# Patient Record
Sex: Female | Born: 1965 | Race: White | Hispanic: No | Marital: Single | Smoking: Former smoker
Health system: Southern US, Community
[De-identification: ages and names within clinical notes are randomized; demographics above are authoritative.]

## PROBLEM LIST (undated history)

## (undated) DIAGNOSIS — F1910 Other psychoactive substance abuse, uncomplicated: Secondary | ICD-10-CM

## (undated) DIAGNOSIS — R112 Nausea with vomiting, unspecified: Secondary | ICD-10-CM

## (undated) DIAGNOSIS — F329 Major depressive disorder, single episode, unspecified: Secondary | ICD-10-CM

## (undated) DIAGNOSIS — G709 Myoneural disorder, unspecified: Secondary | ICD-10-CM

## (undated) DIAGNOSIS — J302 Other seasonal allergic rhinitis: Secondary | ICD-10-CM

## (undated) DIAGNOSIS — J310 Chronic rhinitis: Secondary | ICD-10-CM

## (undated) DIAGNOSIS — F32A Depression, unspecified: Secondary | ICD-10-CM

## (undated) DIAGNOSIS — T7840XA Allergy, unspecified, initial encounter: Secondary | ICD-10-CM

## (undated) DIAGNOSIS — J45909 Unspecified asthma, uncomplicated: Secondary | ICD-10-CM

## (undated) DIAGNOSIS — D649 Anemia, unspecified: Secondary | ICD-10-CM

## (undated) DIAGNOSIS — C801 Malignant (primary) neoplasm, unspecified: Secondary | ICD-10-CM

## (undated) DIAGNOSIS — I10 Essential (primary) hypertension: Secondary | ICD-10-CM

## (undated) DIAGNOSIS — M797 Fibromyalgia: Secondary | ICD-10-CM

## (undated) DIAGNOSIS — F419 Anxiety disorder, unspecified: Secondary | ICD-10-CM

## (undated) DIAGNOSIS — Z9889 Other specified postprocedural states: Secondary | ICD-10-CM

## (undated) DIAGNOSIS — G4730 Sleep apnea, unspecified: Secondary | ICD-10-CM

## (undated) HISTORY — DX: Other psychoactive substance abuse, uncomplicated: F19.10

## (undated) HISTORY — DX: Allergy, unspecified, initial encounter: T78.40XA

## (undated) HISTORY — DX: Myoneural disorder, unspecified: G70.9

## (undated) HISTORY — PX: RETINAL LASER PROCEDURE: SHX2339

## (undated) HISTORY — PX: KNEE ARTHROSCOPY: SUR90

## (undated) HISTORY — PX: TONSILLECTOMY: SUR1361

## (undated) HISTORY — PX: CARPAL TUNNEL RELEASE: SHX101

## (undated) HISTORY — PX: REDUCTION MAMMAPLASTY: SUR839

## (undated) HISTORY — PX: CATARACT EXTRACTION, BILATERAL: SHX1313

---

## 1986-08-30 HISTORY — PX: BREAST SURGERY: SHX581

## 1999-01-18 ENCOUNTER — Emergency Department (HOSPITAL_COMMUNITY): Admission: EM | Admit: 1999-01-18 | Discharge: 1999-01-18 | Payer: Self-pay | Admitting: Emergency Medicine

## 1999-04-13 ENCOUNTER — Emergency Department (HOSPITAL_COMMUNITY): Admission: EM | Admit: 1999-04-13 | Discharge: 1999-04-13 | Payer: Self-pay | Admitting: Internal Medicine

## 1999-11-03 ENCOUNTER — Other Ambulatory Visit: Admission: RE | Admit: 1999-11-03 | Discharge: 1999-11-03 | Payer: Self-pay | Admitting: *Deleted

## 2000-11-23 ENCOUNTER — Ambulatory Visit: Admission: RE | Admit: 2000-11-23 | Discharge: 2000-11-23 | Payer: Self-pay | Admitting: Gynecology

## 2002-12-24 ENCOUNTER — Other Ambulatory Visit: Admission: RE | Admit: 2002-12-24 | Discharge: 2002-12-24 | Payer: Self-pay | Admitting: *Deleted

## 2003-08-31 HISTORY — PX: ABDOMINAL HYSTERECTOMY: SHX81

## 2004-06-04 ENCOUNTER — Other Ambulatory Visit: Admission: RE | Admit: 2004-06-04 | Discharge: 2004-06-04 | Payer: Self-pay | Admitting: *Deleted

## 2005-07-20 ENCOUNTER — Ambulatory Visit (HOSPITAL_BASED_OUTPATIENT_CLINIC_OR_DEPARTMENT_OTHER): Admission: RE | Admit: 2005-07-20 | Discharge: 2005-07-20 | Payer: Self-pay | Admitting: Family Medicine

## 2005-07-25 ENCOUNTER — Ambulatory Visit: Payer: Self-pay | Admitting: Internal Medicine

## 2006-09-07 ENCOUNTER — Other Ambulatory Visit: Admission: RE | Admit: 2006-09-07 | Discharge: 2006-09-07 | Payer: Self-pay | Admitting: *Deleted

## 2008-02-22 ENCOUNTER — Other Ambulatory Visit: Admission: RE | Admit: 2008-02-22 | Discharge: 2008-02-22 | Payer: Self-pay | Admitting: Gynecology

## 2011-01-15 NOTE — Consult Note (Signed)
[facility]  Patient:    Ann Davenport, MISHKIN                         MRN: [id number] Proc. Date: 11/23/00 Adm. Date:  [id number] Attending:  Jeannette Corpus CC:         Almedia Balls. Randell Patient, M.D.  Telford Nab, R.N.   Consultation Report  HISTORY OF PRESENT ILLNESS:  [age]-year-old white female seen in consultation at the request of Dr. Viviann Spare R. Fore for evaluation of ovarian cysts.  The patient has a long-standing history of ovarian cysts which have been associated with intermittent pain.  The cysts have essentially waxed and waned in size.  Most recently, the patient had an ultrasound on February 28th showing a right cystic adnexal mass measuring 4.1 x 3.5 x 4.3 cm, which was slightly tender; in addition, she has a 4.3 x 3.8 x 3.3-cm cyst on the left side with a possible hydrosalpinx.  Prior ultrasound was in November of 2001, which is considerably different.  The patient has previously had an abdominal hysterectomy for large uterine fibroids.  Subsequently, because of worsening pain and cysts, she underwent a laparoscopy on Jan 08, 2000.  At that time, extensive lysis of adhesions was performed and the patient received considerable relief in her right lower quadrant.  She reports now that she is not having any pain at all.  In the interim, she has had two CA125 values done that were 32 and 28 u/ml (this is elevated for this laboratory, where the upper limit of normal is 21).  The patient has no family history of ovarian cancer.  She has a grandmother with breast cancer but no other family members with breast cancer.  There are no colon cancer or endometrial cancer members in the family.  REVIEW OF SYSTEMS:  Otherwise negative.  CURRENT MEDICATIONS:  Progesterone cream.  DRUG ALLERGIES:  SULFA and ERYTHROMYCIN.  PAST SURGICAL HISTORY:  The patient has also had breast reduction surgery and tonsils and adenoidectomy.  PHYSICAL  EXAMINATION:  VITAL SIGNS:  Blood pressure 122/80, pulse 80, respiratory rate 18.  Height 5 feet 4 inches.  Weight 148 pounds.  GENERAL:  The patient is a healthy white female in no acute distress.  HEENT:  Negative.  NECK:  Supple without thyromegaly.  NODES:  There is no supraclavicular or inguinal adenopathy.  ABDOMEN:  Soft and nontender.  No mass, organomegaly, ascites or herniae are noted.  Her incision is well-healed.  PELVIC:  EG/BUS are normal.  Vagina is clean, well-supported.  No lesions noted.  Bimanual and rectovaginal exam reveal no masses, induration or nodularity.  IMPRESSION:  Bilateral ovarian cysts which seem to wax and wane.  In the past they have been associated with pain but the patient reports her pain has entirely resolved.  Slightly elevated CA125.  RECOMMENDATION:  At this juncture, the patient is strongly desirous of preserving ovarian function and given the fact that she is without any symptoms, I think it would be reasonable to follow her.  We did have a lengthy discussion regarding the pros and cons of using CA125 in premenopausal women to assess adnexal masses and she is aware that there are a number of reasons to have a false-positive result.  At this juncture, the patient does not wish to pursue surgical exploration and I indicated that I would be supportive of that approach.  Certainly, if she has worsening of her symptoms or followup ultrasound shows  the mass to have gotten considerably bigger or developed into a more ominous architecture, surgical intervention might be reconsidered.  Patient is in agreement with this plan.  She will return to the care of Dr. Randell Patient and I recommended she have a repeat ultrasound in approximately three months. DD:  11/23/00 TD:  11/24/00 Job: [id number]

## 2011-01-15 NOTE — Procedures (Signed)
NAME:  Ann Davenport, Ann Davenport                ACCOUNT NO.:  [id number]   MEDICAL RECORD NO.:  [id number]          PATIENT TYPE:  OUT   LOCATION:  SLEEP CENTER                 FACILITY:  [facility]   PHYSICIAN:  Clinton D. Maple Hudson, M.D. DATE OF BIRTH:  04-16-1966   DATE OF STUDY:  07/20/2005                              NOCTURNAL POLYSOMNOGRAM   REFERRING PHYSICIAN:  Dr. Raquel Sarna.   INDICATIONS FOR STUDY:  Insomnia with sleep apnea.  Epworth Sleepiness Score  9/24.  BMI 23.  Weight 135 pounds.   SLEEP ARCHITECTURE:  Short total sleep time 281 minutes with sleep  efficiency 65%.  Stage 1 was 5%; stage 2 57%; stages 3 and 4, 25%; REM 13%  of total sleep time.  Sleep latency 52 minutes, REM latency 145 minutes,  awake after sleep onset 101 minutes, arousal index increased at 30.1.  Sleep  onset was at 11:11 p.m., and she indicated that her preferred pattern is to  prepare for sleep sitting in a chair which was hindered by monitoring leads  for this study night.  She was awake from approximately 11:30 until 12:15  a.m. and again from 3:15 to 4 a.m.  She wore her bruxism guard.  No bedtime  medications were taken.   RESPIRATORY DATA:  Apnea-hypopnea index (AHI, RDI):  9 obstructive events  per hour, indicating mild obstructive sleep apnea/hypopnea syndrome. There  were 1 central apnea, 8 obstructive apneas, and 33 hypopneas.  She slept  only supine.  REM AHI 16 per hour.  She was unable to maintain sleep and had  insufficient early events to permit use of CPAP titration by split protocol  on this study night.   OXYGEN DATA:  Mild snoring with oxygen desaturation to a nadir of 91%. Mean  oxygen saturation through the study was 98% on room air.   CARDIAC DATA:  Normal sinus rhythm.   MOVEMENT/PARASOMNIA:  A total of 38 limb jerks were reported of which only 2  were associated with arousal or awakening for periodic limb movement with  arousal index of 0.4 per hour, which is normal.  No bathroom  trips.   IMPRESSION/RECOMMENDATIONS:  1.  Mild obstructive sleep apnea/hypopnea syndrome, AHI 9 per hour while      sleeping only supine.  There was mild snoring.  Events were more common      while in REM (AHI 16 per hour).  2.  Scores in this range may be appropriate for CPAP therapy if more      conservative measures are insufficient.      Consider return for CPAP titration, bringing a sleep medication if      appropriate.  REM suppressing therapy with antidepressants has been      occasionally successful but may increase limb jerk and muscle discomfort      conditions.      Clinton D. Maple Hudson, M.D.  Diplomate, Biomedical engineer of Sleep Medicine  Electronically Signed     CDY/MEDQ  D:  07/25/2005 10:31:43  T:  07/25/2005 22:59:04  Job:  [id number]

## 2012-10-29 ENCOUNTER — Ambulatory Visit (INDEPENDENT_AMBULATORY_CARE_PROVIDER_SITE_OTHER): Payer: 59 | Admitting: Emergency Medicine

## 2012-10-29 VITALS — BP 117/67 | HR 118 | Temp 98.2°F | Resp 20 | Ht 65.0 in | Wt 153.0 lb

## 2012-10-29 DIAGNOSIS — E860 Dehydration: Secondary | ICD-10-CM

## 2012-10-29 DIAGNOSIS — A088 Other specified intestinal infections: Secondary | ICD-10-CM

## 2012-10-29 DIAGNOSIS — R112 Nausea with vomiting, unspecified: Secondary | ICD-10-CM

## 2012-10-29 MED ORDER — ONDANSETRON 4 MG PO TBDP
4.0000 mg | ORAL_TABLET | Freq: Once | ORAL | Status: AC
  Administered 2012-10-29: 4 mg via ORAL

## 2012-10-29 MED ORDER — ONDANSETRON 8 MG PO TBDP: 8.0000 mg | ORAL_TABLET | Freq: Three times a day (TID) | ORAL | Status: DC | PRN

## 2012-10-29 MED ORDER — LOPERAMIDE HCL 2 MG PO TABS: ORAL_TABLET | ORAL | Status: DC

## 2012-10-29 NOTE — Patient Instructions (Addendum)
Viral Gastroenteritis Viral gastroenteritis is also known as stomach flu. This condition affects the stomach and intestinal tract. It can cause sudden diarrhea and vomiting. The illness typically lasts 3 to 8 days. Most people develop an immune response that eventually gets rid of the virus. While this natural response develops, the virus can make you quite ill. CAUSES  Many different viruses can cause gastroenteritis, such as rotavirus or noroviruses. You can catch one of these viruses by consuming contaminated food or water. You may also catch a virus by sharing utensils or other personal items with an infected person or by touching a contaminated surface. SYMPTOMS  The most common symptoms are diarrhea and vomiting. These problems can cause a severe loss of body fluids (dehydration) and a body salt (electrolyte) imbalance. Other symptoms may include:  Fever.  Headache.  Fatigue.  Abdominal pain. DIAGNOSIS  Your caregiver can usually diagnose viral gastroenteritis based on your symptoms and a physical exam. A stool sample may also be taken to test for the presence of viruses or other infections. TREATMENT  This illness typically goes away on its own. Treatments are aimed at rehydration. The most serious cases of viral gastroenteritis involve vomiting so severely that you are not able to keep fluids down. In these cases, fluids must be given through an intravenous line (IV). HOME CARE INSTRUCTIONS   Drink enough fluids to keep your urine clear or pale yellow. Drink small amounts of fluids frequently and increase the amounts as tolerated.  Ask your caregiver for specific rehydration instructions.  Avoid:  Foods high in sugar.  Alcohol.  Carbonated drinks.  Tobacco.  Juice.  Caffeine drinks.  Extremely hot or cold fluids.  Fatty, greasy foods.  Too much intake of anything at one time.  Dairy products until 24 to 48 hours after diarrhea stops.  You may consume probiotics.  Probiotics are active cultures of beneficial bacteria. They may lessen the amount and number of diarrheal stools in adults. Probiotics can be found in yogurt with active cultures and in supplements.  Wash your hands well to avoid spreading the virus.  Only take over-the-counter or prescription medicines for pain, discomfort, or fever as directed by your caregiver. Do not give aspirin to children. Antidiarrheal medicines are not recommended.  Ask your caregiver if you should continue to take your regular prescribed and over-the-counter medicines.  Keep all follow-up appointments as directed by your caregiver. SEEK IMMEDIATE MEDICAL CARE IF:   You are unable to keep fluids down.  You do not urinate at least once every 6 to 8 hours.  You develop shortness of breath.  You notice blood in your stool or vomit. This may look like coffee grounds.  You have abdominal pain that increases or is concentrated in one small area (localized).  You have persistent vomiting or diarrhea.  You have a fever.  The patient is a child younger than 3 months, and he or she has a fever.  The patient is a child older than 3 months, and he or she has a fever and persistent symptoms.  The patient is a child older than 3 months, and he or she has a fever and symptoms suddenly get worse.  The patient is a baby, and he or she has no tears when crying. MAKE SURE YOU:   Understand these instructions.  Will watch your condition.  Will get help right away if you are not doing well or get worse. Document Released: 08/16/2005 Document Revised: 11/08/2011 Document Reviewed: 06/02/2011   ExitCare Patient Information 2013 [employer]. Clear Liquid Diet The clear liquid dietconsists of foods that are liquid or will become liquid at room temperature.You should be able to see through the liquid and beverages. Examples of foods allowed on a clear liquid diet include fruit juice, broth or bouillon, gelatin, or frozen  ice pops. The purpose of this diet is to provide necessary fluid, electrolytes such as sodium and potassium, and energy to keep the body functioning during times when you are not able to consume a regular diet.A clear liquid diet should not be continued for long periods of time as it is not nutritionally adequate.  REASONS FOR USING A CLEAR LIQUID DIET  In sudden onset (acute) conditions for a patient before or after surgery.  As the first step in oral feeding.  For fluid and electrolyte replacement in diarrheal diseases.  As a diet before certain medical tests are performed. ADEQUACY The clear liquid diet is adequate only in ascorbic acid, according to the Recommended Dietary Allowances of the National Research Council. CHOOSING FOODS Breads and Starches  Allowed:  None are allowed.  Avoid: All are avoided. Vegetables  Allowed:  Strained tomato or vegetable juice.  Avoid: Any others. Fruit  Allowed:  Strained fruit juices and fruit drinks. Include 1 serving of citrus or vitamin C-enriched fruit juice daily.  Avoid: Any others. Meat and Meat Substitutes  Allowed:  None are allowed.  Avoid: All are avoided. Milk  Allowed:  None are allowed.  Avoid: All are avoided. Soups and Combination Foods  Allowed:  Clear bouillon, broth, or strained broth-based soups.  Avoid: Any others. Desserts and Sweets  Allowed:  Sugar, honey. High protein gelatin. Flavored gelatin, ices, or frozen ice pops that do not contain milk.  Avoid: Any others. Fats and Oils  Allowed:  None are allowed.  Avoid: All are avoided. Beverages  Allowed: Cereal beverages, coffee (regular or decaffeinated), tea, or soda at the discretion of your caregiver.  Avoid: Any others. Condiments  Allowed:  Iodized salt.  Avoid: Any others, including pepper. Supplements  Allowed:  Liquid nutrition beverages.  Avoid: Any others that contain lactose or fiber. SAMPLE MEAL PLAN Breakfast  4 oz (120  mL) strained orange juice.   to 1 cup (125 to 250 mL) gelatin (plain or fortified).  1 cup (250 mL) beverage (coffee or tea).  Sugar, if desired. Midmorning Snack   cup (125 mL) gelatin (plain or fortified). Lunch  1 cup (250 mL) broth or consomm.  4 oz (120 mL) strained grapefruit juice.   cup (125 mL) gelatin (plain or fortified).  1 cup (250 mL) beverage (coffee or tea).  Sugar, if desired. Midafternoon Snack   cup (125 mL) fruit ice.   cup (125 mL) strained fruit juice. Dinner  1 cup (250 mL) broth or consomm.   cup (125 mL) cranberry juice.   cup (125 mL) flavored gelatin (plain or fortified).  1 cup (250 mL) beverage (coffee or tea).  Sugar, if desired. Evening Snack  4 oz (120 mL) strained apple juice (vitamin C-fortified).   cup (125 mL) flavored gelatin (plain or fortified). Document Released: 08/16/2005 Document Revised: 11/08/2011 Document Reviewed: 11/13/2010 ExitCare Patient Information 2013 [employer].  

## 2012-10-29 NOTE — Progress Notes (Signed)
[facility] [address] [place] [address] [phone]  Date:  10/29/2012   Name:  Ann Davenport   DOB:  06-Sep-1965   MRN:  [id number]  PCP:  No primary Ann Davenport on file.    Chief Complaint: Fever, Emesis and Diarrhea   History of Present Illness:  Ann Davenport is a 47 y.o. very pleasant female patient who presents with the following:  Ill since last night with nausea and vomiting and watery diarrhea.  Cramps, had a fever last night.  Chilled.  Not able to hold down liquids.  Denies blood mucous or pus in stools.  No improvement with over the counter medications or other home remedies. No ill contacts.    There is no problem list on file for this patient.   Past Medical History  Diagnosis Date  . Allergy   . Neuromuscular disorder   . Substance abuse     Past Surgical History  Procedure Laterality Date  . Breast surgery      History  Substance Use Topics  . Smoking status: Never Smoker   . Smokeless tobacco: Not on file  . Alcohol Use: No    History reviewed. No pertinent family history.  Allergies  Allergen Reactions  . Niacin And Related   . Sulfa Antibiotics Rash    Medication list has been reviewed and updated.  No current outpatient prescriptions on file prior to visit.   No current facility-administered medications on file prior to visit.    Review of Systems:  As per HPI, otherwise negative.    Physical Examination: Filed Vitals:   10/29/12 1543  BP: 117/67  Pulse: 118  Temp: 98.2 F (36.8 C)  Resp: 20   Filed Vitals:   10/29/12 1543  Height: 5\' 5"  (1.651 m)  Weight: 153 lb (69.4 kg)   Body mass index is 25.46 kg/(m^2). Ideal Body Weight: Weight in (lb) to have BMI = 25: 149.9  GEN: WDWN, NAD, Non-toxic, A & O x 3 HEENT: Atraumatic, Normocephalic. Neck supple. No masses, No LAD. Ears and Nose: No external deformity. CV: RRR, No M/G/R. No JVD. No thrill. No extra heart sounds. PULM: CTA B, no wheezes,  crackles, rhonchi. No retractions. No resp. distress. No accessory muscle use. ABD: S, NT, ND, +BS. No rebound. No HSM. EXTR: No c/c/e NEURO Normal gait.  PSYCH: Normally interactive. Conversant. Not depressed or anxious appearing.  Calm demeanor.    Assessment and Plan: Gastroenteritis Responded to 2 liters lactated ringers and zofran Clears for 24 hours Follow up as needed  Ann Dane, MD

## 2013-12-31 ENCOUNTER — Other Ambulatory Visit: Payer: Self-pay | Admitting: Internal Medicine

## 2013-12-31 DIAGNOSIS — R109 Unspecified abdominal pain: Secondary | ICD-10-CM

## 2014-01-01 ENCOUNTER — Ambulatory Visit
Admission: RE | Admit: 2014-01-01 | Discharge: 2014-01-01 | Disposition: A | Discharge status: Home / Self Care | Payer: 59 | Source: Ambulatory Visit | Attending: Internal Medicine | Admitting: Internal Medicine

## 2014-01-01 DIAGNOSIS — R109 Unspecified abdominal pain: Secondary | ICD-10-CM

## 2014-02-05 ENCOUNTER — Other Ambulatory Visit: Payer: Self-pay | Admitting: Internal Medicine

## 2014-02-05 ENCOUNTER — Ambulatory Visit
Admission: RE | Admit: 2014-02-05 | Discharge: 2014-02-05 | Disposition: A | Discharge status: Home / Self Care | Payer: 59 | Source: Ambulatory Visit | Attending: Internal Medicine | Admitting: Internal Medicine

## 2014-02-05 DIAGNOSIS — S93409A Sprain of unspecified ligament of unspecified ankle, initial encounter: Secondary | ICD-10-CM

## 2014-02-05 DIAGNOSIS — S93401A Sprain of unspecified ligament of right ankle, initial encounter: Secondary | ICD-10-CM

## 2014-06-28 NOTE — H&P (Signed)
Assessment  Obstructive sleep apnea (327.23) (G47.33). Deviated nasal septum (470) (J34.2). Nasal obstruction (478.19) (J34.89). Discussed  Chronic nasal obstruction, worse and after the recent injury. She clearly has a septal deviation and large turbinates. She also has a small nose in general. She has mild sleep apnea. I think with nasal septal and turbinate surgery, we can improve her nasal airways. We discussed it is unlikely this will cure the sleep apnea. She is interested in surgery. This was discussed in detail. Reason For Visit  Ann Davenport is here today at the kind request of Enid Baas for consultation and opinion for deviated nasal septum due to injury. HPI  One week ago, she accidentally hit the right side of the nose with her car door. She has had significant discomfort and swelling. She had trouble breathing through her nose prior to this but now it's worse especially on the right side. She had a nasal injury as a child but never had it treated. She has mild sleep apnea. She is going to provide [place] with the sleep study results as soon as she can. Allergies  Epinephrine Latex Sulfa Drugs. Current Meds  Bonine 25 MG CHEW (Meclizine HCl);; RPT Vitamin C TABS;; RPT FLUoxetine HCl - 20 MG Oral Capsule;; RPT Vitamin D CAPS;; RPT Loratadine 10 MG Oral Tablet;; RPT Symbicort AERO;; RPT Zinc CAPS;; RPT. Active Problems  Allergic rhinitis   (477.9) (J30.9) Obstructive sleep apnea   (327.23) (G47.33). PMH  History of malignant melanoma (V10.82) (Y65.035); lower leg. [place]  Breast Surgery Reduction Procedure Hysterectomy Knee Surgery Neuroplasty Decompression Median Nerve At Carpal Tunnel Oral Surgery Tooth Extraction Tonsillectomy. Family Hx  Cancer of head: Father (C76.0) Cancer of neck: Father (C76.0) Jaw cancer: Father (C76.0) No pertinent family history: Mother. Personal Hx  Caffeine use (V49.89) (F15.90); 2-3 cups daily Never smoker No alcohol use Non-smoker  (V49.89) (Z78.9). ROS  12 system ROS was obtained and reviewed on the Health Maintenance form dated today.  Positive responses are shown above.  If the symptom is not checked, the patient has denied it. Vital Signs   Recorded by [facility] on 06 Jun 2014 01:51 PM BP:110/70,  Height: 63.5 in, Weight: 178 lb , BMI: 31 kg/m2,  BMI Calculated: 31.04 ,  BSA Calculated: 1.85. Physical Exam  APPEARANCE: Well developed, well nourished, in no acute distress.  Normal affect, in a pleasant mood.  Oriented to time, place and person. COMMUNICATION: Normal voice   HEAD & FACE:  No scars, lesions or masses of head and face.  Sinuses nontender to palpation.  Salivary glands without mass or tenderness.  Facial strength symmetric.  No facial lesion, scars, or mass. EYES: EOMI with normal primary gaze alignment. Visual acuity grossly intact.  PERRLA EXTERNAL EAR & NOSE: No scars, lesions or masses. There is tenderness and swelling of the right nasal bone but no obvious displacement. EAC & TYMPANIC MEMBRANE:  EAC shows no obstructing lesions or debris and tympanic membranes are normal bilaterally with good movement to insufflation. GROSS HEARING: Normal   TMJ:  Nontender  INTRANASAL EXAM: Nasal septal deviation with columellar show on the right. Small nasal cavities with S-shaped deformity, large inferior turbinates and minimal airways bilaterally. NASOPHARYNX: Normal, without lesions. LIPS, TEETH & GUMS: No lip lesions, normal dentition and normal gums. ORAL CAVITY/OROPHARYNX:  Oral mucosa moist without lesion or asymmetry of the palate, tongue, tonsil or posterior pharynx. NECK:  Supple without adenopathy or mass. THYROID:  Normal with no masses palpable.  NEUROLOGIC:  No gross CN deficits. No nystagmus noted.   LYMPHATIC:  No enlarged nodes palpable. Signature  Electronically signed by : Izora Gala  M.D.; 06/06/2014 1:59 PM EST.

## 2014-07-02 ENCOUNTER — Encounter (HOSPITAL_BASED_OUTPATIENT_CLINIC_OR_DEPARTMENT_OTHER): Payer: Self-pay | Admitting: *Deleted

## 2014-07-03 ENCOUNTER — Encounter (HOSPITAL_BASED_OUTPATIENT_CLINIC_OR_DEPARTMENT_OTHER): Payer: Self-pay | Admitting: *Deleted

## 2014-07-03 NOTE — Progress Notes (Signed)
No labs needed-did review with dr hodieron

## 2014-07-04 ENCOUNTER — Ambulatory Visit: Payer: Self-pay | Admitting: Otolaryngology

## 2014-07-04 NOTE — H&P (Signed)
Assessment  Obstructive sleep apnea (327.23) (G47.33). Deviated nasal septum (470) (J34.2). Nasal obstruction (478.19) (J34.89). Discussed  Chronic nasal obstruction, worse and after the recent injury. She clearly has a septal deviation and large turbinates. She also has a small nose in general. She has mild sleep apnea. I think with nasal septal and turbinate surgery, we can improve her nasal airways. We discussed it is unlikely this will cure the sleep apnea. She is interested in surgery. This was discussed in detail. Reason For Visit  Ann Davenport is here today at the kind request of Enid Baas for consultation and opinion for deviated nasal septum due to injury. HPI  One week ago, she accidentally hit the right side of the nose with her car door. She has had significant discomfort and swelling. She had trouble breathing through her nose prior to this but now it's worse especially on the right side. She had a nasal injury as a child but never had it treated. She has mild sleep apnea. She is going to provide [place] with the sleep study results as soon as she can. Allergies  Epinephrine Latex Sulfa Drugs. Current Meds  Bonine 25 MG CHEW (Meclizine HCl);; RPT Vitamin C TABS;; RPT FLUoxetine HCl - 20 MG Oral Capsule;; RPT Vitamin D CAPS;; RPT Loratadine 10 MG Oral Tablet;; RPT Symbicort AERO;; RPT Zinc CAPS;; RPT. Active Problems  Allergic rhinitis   (477.9) (J30.9) Obstructive sleep apnea   (327.23) (G47.33). PMH  History of malignant melanoma (V10.82) (S28.768); lower leg. [place]  Breast Surgery Reduction Procedure Hysterectomy Knee Surgery Neuroplasty Decompression Median Nerve At Carpal Tunnel Oral Surgery Tooth Extraction Tonsillectomy. Family Hx  Cancer of head: Father (C76.0) Cancer of neck: Father (C76.0) Jaw cancer: Father (C76.0) No pertinent family history: Mother. Personal Hx  Caffeine use (V49.89) (F15.90); 2-3 cups daily Never smoker No alcohol use Non-smoker  (V49.89) (Z78.9). ROS  12 system ROS was obtained and reviewed on the Health Maintenance form dated today.  Positive responses are shown above.  If the symptom is not checked, the patient has denied it. Vital Signs   Recorded by [facility] on 06 Jun 2014 01:51 PM BP:110/70,  Height: 63.5 in, Weight: 178 lb , BMI: 31 kg/m2,  BMI Calculated: 31.04 ,  BSA Calculated: 1.85. Physical Exam  APPEARANCE: Well developed, well nourished, in no acute distress.  Normal affect, in a pleasant mood.  Oriented to time, place and person. COMMUNICATION: Normal voice   HEAD & FACE:  No scars, lesions or masses of head and face.  Sinuses nontender to palpation.  Salivary glands without mass or tenderness.  Facial strength symmetric.  No facial lesion, scars, or mass. EYES: EOMI with normal primary gaze alignment. Visual acuity grossly intact.  PERRLA EXTERNAL EAR & NOSE: No scars, lesions or masses. There is tenderness and swelling of the right nasal bone but no obvious displacement. EAC & TYMPANIC MEMBRANE:  EAC shows no obstructing lesions or debris and tympanic membranes are normal bilaterally with good movement to insufflation. GROSS HEARING: Normal   TMJ:  Nontender  INTRANASAL EXAM: Nasal septal deviation with columellar show on the right. Small nasal cavities with S-shaped deformity, large inferior turbinates and minimal airways bilaterally. NASOPHARYNX: Normal, without lesions. LIPS, TEETH & GUMS: No lip lesions, normal dentition and normal gums. ORAL CAVITY/OROPHARYNX:  Oral mucosa moist without lesion or asymmetry of the palate, tongue, tonsil or posterior pharynx. NECK:  Supple without adenopathy or mass. THYROID:  Normal with no masses palpable.  NEUROLOGIC:  No gross CN deficits. No nystagmus noted.   LYMPHATIC:  No enlarged nodes palpable. Signature  Electronically signed by : Izora Gala  M.D.; 06/06/2014 1:59 PM EST.

## 2014-07-05 ENCOUNTER — Ambulatory Visit (HOSPITAL_BASED_OUTPATIENT_CLINIC_OR_DEPARTMENT_OTHER): Payer: 59 | Admitting: Anesthesiology

## 2014-07-05 ENCOUNTER — Ambulatory Visit (HOSPITAL_BASED_OUTPATIENT_CLINIC_OR_DEPARTMENT_OTHER)
Admission: RE | Admit: 2014-07-05 | Discharge: 2014-07-05 | Disposition: A | Discharge status: Home / Self Care | Payer: 59 | Source: Ambulatory Visit | Attending: Otolaryngology | Admitting: Otolaryngology

## 2014-07-05 ENCOUNTER — Encounter (HOSPITAL_BASED_OUTPATIENT_CLINIC_OR_DEPARTMENT_OTHER)
Admission: RE | Disposition: A | Discharge status: Home / Self Care | Payer: Self-pay | Source: Ambulatory Visit | Attending: Otolaryngology

## 2014-07-05 ENCOUNTER — Encounter (HOSPITAL_BASED_OUTPATIENT_CLINIC_OR_DEPARTMENT_OTHER): Payer: Self-pay | Admitting: Anesthesiology

## 2014-07-05 DIAGNOSIS — Z888 Allergy status to other drugs, medicaments and biological substances status: Secondary | ICD-10-CM | POA: Diagnosis not present

## 2014-07-05 DIAGNOSIS — J343 Hypertrophy of nasal turbinates: Secondary | ICD-10-CM | POA: Insufficient documentation

## 2014-07-05 DIAGNOSIS — Z91040 Latex allergy status: Secondary | ICD-10-CM | POA: Insufficient documentation

## 2014-07-05 DIAGNOSIS — J3489 Other specified disorders of nose and nasal sinuses: Secondary | ICD-10-CM | POA: Diagnosis not present

## 2014-07-05 DIAGNOSIS — Z882 Allergy status to sulfonamides status: Secondary | ICD-10-CM | POA: Diagnosis not present

## 2014-07-05 DIAGNOSIS — G4733 Obstructive sleep apnea (adult) (pediatric): Secondary | ICD-10-CM | POA: Insufficient documentation

## 2014-07-05 DIAGNOSIS — J342 Deviated nasal septum: Secondary | ICD-10-CM | POA: Insufficient documentation

## 2014-07-05 DIAGNOSIS — J309 Allergic rhinitis, unspecified: Secondary | ICD-10-CM | POA: Insufficient documentation

## 2014-07-05 HISTORY — DX: Fibromyalgia: M79.7

## 2014-07-05 HISTORY — DX: Other seasonal allergic rhinitis: J30.2

## 2014-07-05 HISTORY — DX: Sleep apnea, unspecified: G47.30

## 2014-07-05 HISTORY — DX: Other specified postprocedural states: Z98.890

## 2014-07-05 HISTORY — DX: Chronic rhinitis: J31.0

## 2014-07-05 HISTORY — DX: Depression, unspecified: F32.A

## 2014-07-05 HISTORY — PX: NASAL SEPTOPLASTY W/ TURBINOPLASTY: SHX2070

## 2014-07-05 HISTORY — DX: Major depressive disorder, single episode, unspecified: F32.9

## 2014-07-05 HISTORY — DX: Nausea with vomiting, unspecified: R11.2

## 2014-07-05 HISTORY — DX: Anxiety disorder, unspecified: F41.9

## 2014-07-05 LAB — POCT HEMOGLOBIN-HEMACUE: Hemoglobin: 14.2 g/dL (ref 12.0–15.0)

## 2014-07-05 SURGERY — SEPTOPLASTY, NOSE, WITH NASAL TURBINATE REDUCTION
Anesthesia: General | Site: Nose | Laterality: Bilateral

## 2014-07-05 MED ORDER — HYDROMORPHONE HCL 1 MG/ML IJ SOLN
INTRAMUSCULAR | Status: AC
  Filled 2014-07-05: qty 1

## 2014-07-05 MED ORDER — MIDAZOLAM HCL 5 MG/5ML IJ SOLN
INTRAMUSCULAR | Status: DC | PRN
  Administered 2014-07-05: 2 mg via INTRAVENOUS

## 2014-07-05 MED ORDER — CEPHALEXIN 500 MG PO CAPS: 500.0000 mg | ORAL_CAPSULE | Freq: Three times a day (TID) | ORAL | Status: DC

## 2014-07-05 MED ORDER — LIDOCAINE-EPINEPHRINE 1 %-1:100000 IJ SOLN
INTRAMUSCULAR | Status: DC | PRN
  Administered 2014-07-05: 6 mL

## 2014-07-05 MED ORDER — LACTATED RINGERS IV SOLN
INTRAVENOUS | Status: DC | PRN
  Administered 2014-07-05: 10:00:00 via INTRAVENOUS

## 2014-07-05 MED ORDER — BACITRACIN ZINC 500 UNIT/GM EX OINT
TOPICAL_OINTMENT | CUTANEOUS | Status: DC | PRN
  Administered 2014-07-05: 1 via TOPICAL

## 2014-07-05 MED ORDER — OXYMETAZOLINE HCL 0.05 % NA SOLN
NASAL | Status: AC
  Filled 2014-07-05: qty 15

## 2014-07-05 MED ORDER — CEFAZOLIN SODIUM-DEXTROSE 2-3 GM-% IV SOLR: 2.0000 g | INTRAVENOUS | Status: DC

## 2014-07-05 MED ORDER — FENTANYL CITRATE 0.05 MG/ML IJ SOLN
INTRAMUSCULAR | Status: AC
  Filled 2014-07-05: qty 4

## 2014-07-05 MED ORDER — MIDAZOLAM HCL 2 MG/2ML IJ SOLN
INTRAMUSCULAR | Status: AC
  Filled 2014-07-05: qty 2

## 2014-07-05 MED ORDER — OXYCODONE HCL 5 MG PO TABS: 5.0000 mg | ORAL_TABLET | Freq: Once | ORAL | Status: DC | PRN

## 2014-07-05 MED ORDER — OXYMETAZOLINE HCL 0.05 % NA SOLN
NASAL | Status: DC | PRN
  Administered 2014-07-05: 1 via NASAL

## 2014-07-05 MED ORDER — PROPOFOL 10 MG/ML IV BOLUS
INTRAVENOUS | Status: DC | PRN
  Administered 2014-07-05: 150 mg via INTRAVENOUS

## 2014-07-05 MED ORDER — LIDOCAINE HCL (CARDIAC) 10 MG/ML IV SOLN
INTRAVENOUS | Status: DC | PRN
  Administered 2014-07-05: 80 mg via INTRAVENOUS

## 2014-07-05 MED ORDER — FENTANYL CITRATE 0.05 MG/ML IJ SOLN
50.0000 ug | INTRAMUSCULAR | Status: DC | PRN
Start: 2014-07-05 — End: 2014-07-05

## 2014-07-05 MED ORDER — LACTATED RINGERS IV SOLN
INTRAVENOUS | Status: DC
  Administered 2014-07-05: 10 mL/h via INTRAVENOUS
  Administered 2014-07-05: 10:00:00 via INTRAVENOUS

## 2014-07-05 MED ORDER — OXYMETAZOLINE HCL 0.05 % NA SOLN
2.0000 | NASAL | Status: DC
  Administered 2014-07-05: 2 via NASAL

## 2014-07-05 MED ORDER — MIDAZOLAM HCL 2 MG/2ML IJ SOLN
1.0000 mg | INTRAMUSCULAR | Status: DC | PRN
Start: 2014-07-05 — End: 2014-07-05

## 2014-07-05 MED ORDER — PROPOFOL 10 MG/ML IV BOLUS
INTRAVENOUS | Status: AC
  Filled 2014-07-05: qty 20

## 2014-07-05 MED ORDER — LIDOCAINE-EPINEPHRINE 1 %-1:100000 IJ SOLN
INTRAMUSCULAR | Status: AC
  Filled 2014-07-05: qty 1

## 2014-07-05 MED ORDER — FENTANYL CITRATE 0.05 MG/ML IJ SOLN
INTRAMUSCULAR | Status: DC | PRN
  Administered 2014-07-05: 25 ug via INTRAVENOUS
  Administered 2014-07-05 (×2): 50 ug via INTRAVENOUS
  Administered 2014-07-05 (×3): 25 ug via INTRAVENOUS

## 2014-07-05 MED ORDER — DEXAMETHASONE SODIUM PHOSPHATE 4 MG/ML IJ SOLN
INTRAMUSCULAR | Status: DC | PRN
  Administered 2014-07-05: 10 mg via INTRAVENOUS

## 2014-07-05 MED ORDER — BACITRACIN ZINC 500 UNIT/GM EX OINT
TOPICAL_OINTMENT | CUTANEOUS | Status: AC
  Filled 2014-07-05: qty 28.35

## 2014-07-05 MED ORDER — PROMETHAZINE HCL 25 MG/ML IJ SOLN: 6.2500 mg | INTRAMUSCULAR | Status: DC | PRN

## 2014-07-05 MED ORDER — PROMETHAZINE HCL 25 MG RE SUPP: 25.0000 mg | Freq: Four times a day (QID) | RECTAL | Status: DC | PRN

## 2014-07-05 MED ORDER — HYDROMORPHONE HCL 1 MG/ML IJ SOLN
0.2500 mg | INTRAMUSCULAR | Status: DC | PRN
  Administered 2014-07-05 (×3): 0.5 mg via INTRAVENOUS

## 2014-07-05 MED ORDER — SUCCINYLCHOLINE CHLORIDE 20 MG/ML IJ SOLN
INTRAMUSCULAR | Status: DC | PRN
  Administered 2014-07-05: 100 mg via INTRAVENOUS

## 2014-07-05 MED ORDER — OXYMETAZOLINE HCL 0.05 % NA SOLN: 2.0000 | NASAL | Status: DC

## 2014-07-05 MED ORDER — HYDROCODONE-ACETAMINOPHEN 7.5-325 MG PO TABS: 1.0000 | ORAL_TABLET | Freq: Four times a day (QID) | ORAL | Status: DC | PRN

## 2014-07-05 MED ORDER — CEFAZOLIN SODIUM-DEXTROSE 2-3 GM-% IV SOLR
INTRAVENOUS | Status: AC
  Filled 2014-07-05: qty 50

## 2014-07-05 MED ORDER — OXYCODONE HCL 5 MG/5ML PO SOLN: 5.0000 mg | Freq: Once | ORAL | Status: DC | PRN

## 2014-07-05 MED ORDER — CEFAZOLIN SODIUM-DEXTROSE 2-3 GM-% IV SOLR
2.0000 g | INTRAVENOUS | Status: AC
  Administered 2014-07-05: 2 g via INTRAVENOUS

## 2014-07-05 MED ORDER — ONDANSETRON HCL 4 MG/2ML IJ SOLN
INTRAMUSCULAR | Status: DC | PRN
  Administered 2014-07-05: 4 mg via INTRAVENOUS

## 2014-07-05 SURGICAL SUPPLY — 27 items
ATTRACTOMAT 16X20 MAGNETIC DRP (DRAPES) IMPLANT
CANISTER SUCT 1200ML W/VALVE (MISCELLANEOUS) ×2 IMPLANT
DECANTER SPIKE VIAL GLASS SM (MISCELLANEOUS) ×2 IMPLANT
DRSG NASOPORE 8CM (GAUZE/BANDAGES/DRESSINGS) IMPLANT
DRSG TELFA 3X8 NADH (GAUZE/BANDAGES/DRESSINGS) ×2 IMPLANT
GAUZE SPONGE 4X4 16PLY XRAY LF (GAUZE/BANDAGES/DRESSINGS) IMPLANT
GLOVE ECLIPSE 7.5 STRL STRAW (GLOVE) ×2 IMPLANT
GLOVE SURG SS PI 7.0 STRL IVOR (GLOVE) ×2 IMPLANT
GOWN STRL REUS W/ TWL LRG LVL3 (GOWN DISPOSABLE) ×2 IMPLANT
GOWN STRL REUS W/TWL LRG LVL3 (GOWN DISPOSABLE) ×2
HEMOSTAT SURGICEL .5X2 ABSORB (HEMOSTASIS) IMPLANT
NEEDLE 27GAX1X1/2 (NEEDLE) ×2 IMPLANT
NS IRRIG 1000ML POUR BTL (IV SOLUTION) ×2 IMPLANT
PACK BASIN DAY SURGERY FS (CUSTOM PROCEDURE TRAY) ×2 IMPLANT
PACK ENT DAY SURGERY (CUSTOM PROCEDURE TRAY) ×2 IMPLANT
PATTIES SURGICAL .5 X3 (DISPOSABLE) ×2 IMPLANT
SHEET SILASTIC 8X6X.030 25-30 (MISCELLANEOUS) IMPLANT
SLEEVE SCD COMPRESS KNEE MED (MISCELLANEOUS) ×2 IMPLANT
SPONGE GAUZE 2X2 8PLY STRL LF (GAUZE/BANDAGES/DRESSINGS) ×2 IMPLANT
STRIP CLOSURE SKIN 1/2X4 (GAUZE/BANDAGES/DRESSINGS) IMPLANT
SUT CHROMIC 4 0 P 3 18 (SUTURE) ×2 IMPLANT
SUT ETHILON 3 0 PS 1 (SUTURE) IMPLANT
SUT ETHILON 4 0 CL P 3 (SUTURE) IMPLANT
SUT ETHILON 6 0 P 1 (SUTURE) IMPLANT
SUT PLAIN 4 0 ~~LOC~~ 1 (SUTURE) ×2 IMPLANT
TOWEL OR 17X24 6PK STRL BLUE (TOWEL DISPOSABLE) ×2 IMPLANT
YANKAUER SUCT BULB TIP NO VENT (SUCTIONS) ×2 IMPLANT

## 2014-07-05 NOTE — H&P (View-Only) (Signed)
Assessment  Obstructive sleep apnea (327.23) (G47.33). Deviated nasal septum (470) (J34.2). Nasal obstruction (478.19) (J34.89). Discussed  Chronic nasal obstruction, worse and after the recent injury. She clearly has a septal deviation and large turbinates. She also has a small nose in general. She has mild sleep apnea. I think with nasal septal and turbinate surgery, we can improve her nasal airways. We discussed it is unlikely this will cure the sleep apnea. She is interested in surgery. This was discussed in detail. Reason For Visit  Ann Davenport is here today at the kind request of Enid Baas for consultation and opinion for deviated nasal septum due to injury. HPI  One week ago, she accidentally hit the right side of the nose with her car door. She has had significant discomfort and swelling. She had trouble breathing through her nose prior to this but now it's worse especially on the right side. She had a nasal injury as a child but never had it treated. She has mild sleep apnea. She is going to provide [place] with the sleep study results as soon as she can. Allergies  Epinephrine Latex Sulfa Drugs. Current Meds  Bonine 25 MG CHEW (Meclizine HCl);; RPT Vitamin C TABS;; RPT FLUoxetine HCl - 20 MG Oral Capsule;; RPT Vitamin D CAPS;; RPT Loratadine 10 MG Oral Tablet;; RPT Symbicort AERO;; RPT Zinc CAPS;; RPT. Active Problems  Allergic rhinitis   (477.9) (J30.9) Obstructive sleep apnea   (327.23) (G47.33). PMH  History of malignant melanoma (V10.82) (P71.062); lower leg. [place]  Breast Surgery Reduction Procedure Hysterectomy Knee Surgery Neuroplasty Decompression Median Nerve At Carpal Tunnel Oral Surgery Tooth Extraction Tonsillectomy. Family Hx  Cancer of head: Father (C76.0) Cancer of neck: Father (C76.0) Jaw cancer: Father (C76.0) No pertinent family history: Mother. Personal Hx  Caffeine use (V49.89) (F15.90); 2-3 cups daily Never smoker No alcohol use Non-smoker  (V49.89) (Z78.9). ROS  12 system ROS was obtained and reviewed on the Health Maintenance form dated today.  Positive responses are shown above.  If the symptom is not checked, the patient has denied it. Vital Signs   Recorded by [facility] on 06 Jun 2014 01:51 PM BP:110/70,  Height: 63.5 in, Weight: 178 lb , BMI: 31 kg/m2,  BMI Calculated: 31.04 ,  BSA Calculated: 1.85. Physical Exam  APPEARANCE: Well developed, well nourished, in no acute distress.  Normal affect, in a pleasant mood.  Oriented to time, place and person. COMMUNICATION: Normal voice   HEAD & FACE:  No scars, lesions or masses of head and face.  Sinuses nontender to palpation.  Salivary glands without mass or tenderness.  Facial strength symmetric.  No facial lesion, scars, or mass. EYES: EOMI with normal primary gaze alignment. Visual acuity grossly intact.  PERRLA EXTERNAL EAR & NOSE: No scars, lesions or masses. There is tenderness and swelling of the right nasal bone but no obvious displacement. EAC & TYMPANIC MEMBRANE:  EAC shows no obstructing lesions or debris and tympanic membranes are normal bilaterally with good movement to insufflation. GROSS HEARING: Normal   TMJ:  Nontender  INTRANASAL EXAM: Nasal septal deviation with columellar show on the right. Small nasal cavities with S-shaped deformity, large inferior turbinates and minimal airways bilaterally. NASOPHARYNX: Normal, without lesions. LIPS, TEETH & GUMS: No lip lesions, normal dentition and normal gums. ORAL CAVITY/OROPHARYNX:  Oral mucosa moist without lesion or asymmetry of the palate, tongue, tonsil or posterior pharynx. NECK:  Supple without adenopathy or mass. THYROID:  Normal with no masses palpable.  NEUROLOGIC:  No gross CN deficits. No nystagmus noted.   LYMPHATIC:  No enlarged nodes palpable. Signature  Electronically signed by : Izora Gala  M.D.; 06/06/2014 1:59 PM EST.

## 2014-07-05 NOTE — Anesthesia Procedure Notes (Signed)
Procedure Name: Intubation Date/Time: 07/05/2014 10:47 AM Performed by: Lyndee Leo Pre-anesthesia Checklist: Patient identified, Emergency Drugs available, Suction available and Patient being monitored Patient Re-evaluated:Patient Re-evaluated prior to inductionOxygen Delivery Method: Circle System Utilized Preoxygenation: Pre-oxygenation with 100% oxygen Intubation Type: IV induction Ventilation: Mask ventilation without difficulty Laryngoscope Size: Mac and 3 Grade View: Grade II Tube type: Oral Rae Number of attempts: 1 Airway Equipment and Method: stylet and oral airway Placement Confirmation: ETT inserted through vocal cords under direct vision,  positive ETCO2 and breath sounds checked- equal and bilateral Tube secured with: Tape Dental Injury: Teeth and Oropharynx as per pre-operative assessment

## 2014-07-05 NOTE — Transfer of Care (Signed)
Immediate Anesthesia Transfer of Care Note  Patient: Ann Davenport  Procedure(s) Performed: Procedure(s): NASAL SEPTOPLASTY WITH BILATERAL TURBINATE REDUCTION (Bilateral)  Patient Location: PACU  Anesthesia Type:General  Level of Consciousness: awake, sedated and patient cooperative  Airway & Oxygen Therapy: Patient Spontanous Breathing and aerosol face mask  Post-op Assessment: Report given to PACU RN and Post -op Vital signs reviewed and stable  Post vital signs: Reviewed and stable  Complications: No apparent anesthesia complications

## 2014-07-05 NOTE — Interval H&P Note (Signed)
History and Physical Interval Note:  07/05/2014 10:22 AM  Alfonse Alpers  has presented today for surgery, with the diagnosis of Gramling   The various methods of treatment have been discussed with the patient and family. After consideration of risks, benefits and other options for treatment, the patient has consented to  Procedure(s): NASAL SEPTOPLASTY WITH BILATERAL TURBINATE REDUCTION (Bilateral) as a surgical intervention .  The patient's history has been reviewed, patient examined, no change in status, stable for surgery.  I have reviewed the patient's chart and labs.  Questions were answered to the patient's satisfaction.     Kimoni Pickerill

## 2014-07-05 NOTE — Op Note (Signed)
OPERATIVE REPORT  DATE OF SURGERY: 07/05/2014  PATIENT:  Ann Davenport,  48 y.o. 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 Following induction of general endotracheal anesthesia, the face was draped in a standard fashion. Afrin spray was used preoperatively in the nasal cavities. 1% Xylocaine with epinephrine was infiltrated into the septum, columella, the inferior turbinates. Afrin pledgets were placed bilaterally.  1. Nasal septoplasty. A left hemitransfixion incision was used to approach the septal cartilage and a flap was developed posteriorly down the left side to the sphenoid rostrum. The bony cartilaginous junction was divided and a similar flap was developed on the right side. There is a S-shaped deformity of the ethmoid plate and it was severely thickened. This was taken down with Jansen-Middleton rongeurs. There is a leftward spurring of the maxillary crest that was taken down with an osteotome. There was columellar show on the right side. The quadrangular cartilage was dissected out of the columellar pocket and about 2 mm of the caudal end were trimmed off with the scalpel. The cartilage  wasreturned back to its pocket. The mucosal incision was reapproximated with chromic suture. The septal flaps were quilted with plain gut. There were corresponding mucosal tears and care was taken to re-oppose all the edges nicely.  2. SMR inferior turbinates. The  leading edge of the inferior turbinates were incised in a vertical fashion with a 15 scalpel. Mucosa was elevated off bone in all directions. Bony fragments were removed. Remnants were outfractured with the Cottle elevator. Nasal airways were dramatically improved. Blood and secretions were suctioned out. All for packs were placed bilaterally. Pharynx was suctioned of blood and secretions. Patient was awakened extubated and transferred to recovery in stable condition.    PATIENT DISPOSITION:  To PACU, stable

## 2014-07-05 NOTE — Anesthesia Postprocedure Evaluation (Signed)
  Anesthesia Post-op Note  Patient: [employer]  Procedure(s) Performed: Procedure(s): NASAL SEPTOPLASTY WITH BILATERAL TURBINATE REDUCTION (Bilateral)  Patient Location: PACU  Anesthesia Type:General  Level of Consciousness: awake and alert   Airway and Oxygen Therapy: Patient Spontanous Breathing  Post-op Pain: mild  Post-op Assessment: Post-op Vital signs reviewed  Post-op Vital Signs: stable  Last Vitals:  Filed Vitals:   07/05/14 1345  BP: 127/80  Pulse: 85  Temp: 36.6 C  Resp: 20    Complications: No apparent anesthesia complications

## 2014-07-05 NOTE — Anesthesia Preprocedure Evaluation (Signed)
Anesthesia Evaluation  Patient identified by MRN, date of birth, ID band Patient awake    Reviewed: Allergy & Precautions, H&P , NPO status   History of Anesthesia Complications (+) PONV  Airway Mallampati: I       Dental  (+) Teeth Intact   Pulmonary sleep apnea ,  breath sounds clear to auscultation        Cardiovascular negative cardio ROS  Rhythm:Regular Rate:Normal     Neuro/Psych  Neuromuscular disease    GI/Hepatic negative GI ROS, Neg liver ROS,   Endo/Other  negative endocrine ROS  Renal/GU negative Renal ROS     Musculoskeletal  (+) Fibromyalgia -  Abdominal   Peds  Hematology negative hematology ROS (+)   Anesthesia Other Findings   Reproductive/Obstetrics                             Anesthesia Physical Anesthesia Plan  ASA: II  Anesthesia Plan: General   Post-op Pain Management:    Induction: Intravenous  Airway Management Planned: Oral ETT and LMA  Additional Equipment:   Intra-op Plan:   Post-operative Plan: Extubation in OR  Informed Consent: I have reviewed the patients History and Physical, chart, labs and discussed the procedure including the risks, benefits and alternatives for the proposed anesthesia with the patient or authorized representative who has indicated his/her understanding and acceptance.   Dental advisory given  Plan Discussed with:   Anesthesia Plan Comments:         Anesthesia Quick Evaluation

## 2014-07-05 NOTE — Discharge Instructions (Signed)
Septoplasty Care After Refer to this sheet in the next few weeks. These instructions provide you with information on caring for yourself after your procedure. Your caregiver may also give you more specific instructions. Your treatment has been planned according to current medical practices, but problems sometimes occur. Call your caregiver if you have any problems or questions after your procedure. HOME CARE INSTRUCTIONS  If antibiotic medicines are prescribed, take them as directed. Finish them even if you start to feel better.  Only take over-the-counter or prescription medicines for pain, discomfort, or fever as directed by your caregiver.  You may clean your nostrils with an over-the-counter nasal saline spray. This will help clear the crusts and blood clots in your nose. You can also make your own saline solution by mixing the following:   tsp of salt.   tsp of baking soda.  6 oz of water. If the saline solution is too irritating, you can add more water.  Do not blow your nose for 2 weeks after surgery.  Avoid strenuous activity such as running or playing sports for 2 weeks. This can cause nosebleeds.  Do not lift anything heavier than 10 pounds (4.5 kg) for 2 weeks, or as directed by your caregiver.  Use a couple pillows to elevate your head when lying down.  Eat plenty of fiber to keep your stools soft, especially while taking pain medicine. This avoids straining, which can cause a nosebleed.  You may take laxatives or stool softeners as directed by your caregiver.  Keep all follow-up appointments as directed by your caregiver. If you have nasal splints, they will be removed about 1 week after surgery. SEEK MEDICAL CARE IF:  You develop redness, swelling, or increasing pain in your nose.  You have yellowish-white fluid (pus) coming from your nose.  You have a severe headache or stiff neck.  You have severe diarrhea or persistent nausea and vomiting.  You cannot  breathe through your nose.  You have any questions or concerns. SEEK IMMEDIATE MEDICAL CARE IF:   You have a rash or upset stomach.  You have a fever.  You are short of breath.  You develop any reaction or side effects to your medicines.  You feel dizzy, or you faint.  You have vision changes or swollen eyes.  You are bleeding heavily from the nose. MAKE SURE YOU:  Understand these instructions.  Will watch your condition.  Will get help right away if you are not doing well or get worse. Document Released: 08/16/2005 Document Revised: 11/08/2011 Document Reviewed: 09/27/2011 [facility] Patient Information 2015 [place], [place]. This information is not intended to replace advice given to you by your health care provider. Make sure you discuss any questions you have with your health care provider.    Post Anesthesia Home Care Instructions  Activity: Get plenty of rest for the remainder of the day. A responsible adult should stay with you for 24 hours following the procedure.  For the next 24 hours, DO NOT: -Drive a car -Paediatric nurse -Drink alcoholic beverages -Take any medication unless instructed by your physician -Make any legal decisions or sign important papers.  Meals: Start with liquid foods such as gelatin or soup. Progress to regular foods as tolerated. Avoid greasy, spicy, heavy foods. If nausea and/or vomiting occur, drink only clear liquids until the nausea and/or vomiting subsides. Call your physician if vomiting continues.  Special Instructions/Symptoms: Your throat may feel dry or sore from the anesthesia or the breathing tube placed in  your throat during surgery. If this causes discomfort, gargle with warm salt water. The discomfort should disappear within 24 hours.

## 2014-07-08 ENCOUNTER — Encounter (HOSPITAL_BASED_OUTPATIENT_CLINIC_OR_DEPARTMENT_OTHER): Payer: Self-pay | Admitting: Otolaryngology

## 2016-12-27 DIAGNOSIS — L821 Other seborrheic keratosis: Secondary | ICD-10-CM | POA: Diagnosis not present

## 2016-12-27 DIAGNOSIS — D2372 Other benign neoplasm of skin of left lower limb, including hip: Secondary | ICD-10-CM | POA: Diagnosis not present

## 2016-12-27 DIAGNOSIS — D485 Neoplasm of uncertain behavior of skin: Secondary | ICD-10-CM | POA: Diagnosis not present

## 2016-12-27 DIAGNOSIS — Z85820 Personal history of malignant melanoma of skin: Secondary | ICD-10-CM | POA: Diagnosis not present

## 2016-12-27 DIAGNOSIS — D1800 Hemangioma unspecified site: Secondary | ICD-10-CM | POA: Diagnosis not present

## 2017-01-17 DIAGNOSIS — H524 Presbyopia: Secondary | ICD-10-CM | POA: Diagnosis not present

## 2017-01-17 DIAGNOSIS — H5213 Myopia, bilateral: Secondary | ICD-10-CM | POA: Diagnosis not present

## 2017-02-02 DIAGNOSIS — D239 Other benign neoplasm of skin, unspecified: Secondary | ICD-10-CM | POA: Diagnosis not present

## 2017-02-02 DIAGNOSIS — L905 Scar conditions and fibrosis of skin: Secondary | ICD-10-CM | POA: Diagnosis not present

## 2017-03-03 DIAGNOSIS — K529 Noninfective gastroenteritis and colitis, unspecified: Secondary | ICD-10-CM | POA: Diagnosis not present

## 2017-05-09 DIAGNOSIS — J45991 Cough variant asthma: Secondary | ICD-10-CM | POA: Diagnosis not present

## 2017-05-09 DIAGNOSIS — Z79899 Other long term (current) drug therapy: Secondary | ICD-10-CM | POA: Diagnosis not present

## 2017-05-09 DIAGNOSIS — Z0000 Encounter for general adult medical examination without abnormal findings: Secondary | ICD-10-CM | POA: Diagnosis not present

## 2017-05-09 DIAGNOSIS — M797 Fibromyalgia: Secondary | ICD-10-CM | POA: Diagnosis not present

## 2017-05-09 DIAGNOSIS — Z23 Encounter for immunization: Secondary | ICD-10-CM | POA: Diagnosis not present

## 2017-05-09 DIAGNOSIS — E7800 Pure hypercholesterolemia, unspecified: Secondary | ICD-10-CM | POA: Diagnosis not present

## 2017-06-08 ENCOUNTER — Other Ambulatory Visit: Payer: Self-pay | Admitting: Internal Medicine

## 2017-06-08 ENCOUNTER — Ambulatory Visit
Admission: RE | Admit: 2017-06-08 | Discharge: 2017-06-08 | Disposition: A | Discharge status: Home / Self Care | Payer: 59 | Source: Ambulatory Visit | Attending: Internal Medicine | Admitting: Internal Medicine

## 2017-06-08 DIAGNOSIS — S99921A Unspecified injury of right foot, initial encounter: Secondary | ICD-10-CM

## 2017-06-20 DIAGNOSIS — M25571 Pain in right ankle and joints of right foot: Secondary | ICD-10-CM | POA: Diagnosis not present

## 2017-06-20 DIAGNOSIS — S92514A Nondisplaced fracture of proximal phalanx of right lesser toe(s), initial encounter for closed fracture: Secondary | ICD-10-CM | POA: Diagnosis not present

## 2017-06-27 DIAGNOSIS — S92514A Nondisplaced fracture of proximal phalanx of right lesser toe(s), initial encounter for closed fracture: Secondary | ICD-10-CM | POA: Diagnosis not present

## 2017-07-04 ENCOUNTER — Ambulatory Visit: Payer: Self-pay | Admitting: Podiatry

## 2017-07-28 DIAGNOSIS — D485 Neoplasm of uncertain behavior of skin: Secondary | ICD-10-CM | POA: Diagnosis not present

## 2017-07-28 DIAGNOSIS — D1800 Hemangioma unspecified site: Secondary | ICD-10-CM | POA: Diagnosis not present

## 2017-07-28 DIAGNOSIS — Z85820 Personal history of malignant melanoma of skin: Secondary | ICD-10-CM | POA: Diagnosis not present

## 2017-07-29 DIAGNOSIS — S92514D Nondisplaced fracture of proximal phalanx of right lesser toe(s), subsequent encounter for fracture with routine healing: Secondary | ICD-10-CM | POA: Diagnosis not present

## 2017-09-02 DIAGNOSIS — Z01419 Encounter for gynecological examination (general) (routine) without abnormal findings: Secondary | ICD-10-CM | POA: Diagnosis not present

## 2017-10-13 DIAGNOSIS — M545 Low back pain: Secondary | ICD-10-CM | POA: Diagnosis not present

## 2017-10-13 DIAGNOSIS — M50323 Other cervical disc degeneration at C6-C7 level: Secondary | ICD-10-CM | POA: Diagnosis not present

## 2017-10-13 DIAGNOSIS — M542 Cervicalgia: Secondary | ICD-10-CM | POA: Diagnosis not present

## 2017-10-13 DIAGNOSIS — M5126 Other intervertebral disc displacement, lumbar region: Secondary | ICD-10-CM | POA: Diagnosis not present

## 2017-10-13 DIAGNOSIS — M50223 Other cervical disc displacement at C6-C7 level: Secondary | ICD-10-CM | POA: Diagnosis not present

## 2017-10-20 DIAGNOSIS — M791 Myalgia, unspecified site: Secondary | ICD-10-CM | POA: Diagnosis not present

## 2017-10-20 DIAGNOSIS — M5126 Other intervertebral disc displacement, lumbar region: Secondary | ICD-10-CM | POA: Diagnosis not present

## 2017-10-20 DIAGNOSIS — M542 Cervicalgia: Secondary | ICD-10-CM | POA: Diagnosis not present

## 2017-10-25 DIAGNOSIS — M50223 Other cervical disc displacement at C6-C7 level: Secondary | ICD-10-CM | POA: Diagnosis not present

## 2017-10-25 DIAGNOSIS — M791 Myalgia, unspecified site: Secondary | ICD-10-CM | POA: Diagnosis not present

## 2017-10-25 DIAGNOSIS — M5126 Other intervertebral disc displacement, lumbar region: Secondary | ICD-10-CM | POA: Diagnosis not present

## 2017-10-25 DIAGNOSIS — M542 Cervicalgia: Secondary | ICD-10-CM | POA: Diagnosis not present

## 2017-10-25 DIAGNOSIS — M50323 Other cervical disc degeneration at C6-C7 level: Secondary | ICD-10-CM | POA: Diagnosis not present

## 2017-11-01 DIAGNOSIS — M791 Myalgia, unspecified site: Secondary | ICD-10-CM | POA: Diagnosis not present

## 2017-11-01 DIAGNOSIS — M5126 Other intervertebral disc displacement, lumbar region: Secondary | ICD-10-CM | POA: Diagnosis not present

## 2017-11-01 DIAGNOSIS — M542 Cervicalgia: Secondary | ICD-10-CM | POA: Diagnosis not present

## 2017-11-08 DIAGNOSIS — M5126 Other intervertebral disc displacement, lumbar region: Secondary | ICD-10-CM | POA: Diagnosis not present

## 2017-11-08 DIAGNOSIS — M542 Cervicalgia: Secondary | ICD-10-CM | POA: Diagnosis not present

## 2017-11-08 DIAGNOSIS — M791 Myalgia, unspecified site: Secondary | ICD-10-CM | POA: Diagnosis not present

## 2017-11-15 DIAGNOSIS — M791 Myalgia, unspecified site: Secondary | ICD-10-CM | POA: Diagnosis not present

## 2017-11-15 DIAGNOSIS — M542 Cervicalgia: Secondary | ICD-10-CM | POA: Diagnosis not present

## 2017-11-15 DIAGNOSIS — M5126 Other intervertebral disc displacement, lumbar region: Secondary | ICD-10-CM | POA: Diagnosis not present

## 2017-11-23 DIAGNOSIS — M542 Cervicalgia: Secondary | ICD-10-CM | POA: Diagnosis not present

## 2017-11-23 DIAGNOSIS — M5126 Other intervertebral disc displacement, lumbar region: Secondary | ICD-10-CM | POA: Diagnosis not present

## 2017-11-23 DIAGNOSIS — M50223 Other cervical disc displacement at C6-C7 level: Secondary | ICD-10-CM | POA: Diagnosis not present

## 2017-11-23 DIAGNOSIS — M50323 Other cervical disc degeneration at C6-C7 level: Secondary | ICD-10-CM | POA: Diagnosis not present

## 2017-11-23 DIAGNOSIS — M791 Myalgia, unspecified site: Secondary | ICD-10-CM | POA: Diagnosis not present

## 2017-11-29 DIAGNOSIS — M542 Cervicalgia: Secondary | ICD-10-CM | POA: Diagnosis not present

## 2017-11-29 DIAGNOSIS — M5124 Other intervertebral disc displacement, thoracic region: Secondary | ICD-10-CM | POA: Diagnosis not present

## 2017-11-29 DIAGNOSIS — M791 Myalgia, unspecified site: Secondary | ICD-10-CM | POA: Diagnosis not present

## 2017-12-06 DIAGNOSIS — M542 Cervicalgia: Secondary | ICD-10-CM | POA: Diagnosis not present

## 2017-12-06 DIAGNOSIS — M791 Myalgia, unspecified site: Secondary | ICD-10-CM | POA: Diagnosis not present

## 2017-12-06 DIAGNOSIS — M5124 Other intervertebral disc displacement, thoracic region: Secondary | ICD-10-CM | POA: Diagnosis not present

## 2017-12-13 DIAGNOSIS — M50323 Other cervical disc degeneration at C6-C7 level: Secondary | ICD-10-CM | POA: Diagnosis not present

## 2017-12-13 DIAGNOSIS — M50223 Other cervical disc displacement at C6-C7 level: Secondary | ICD-10-CM | POA: Diagnosis not present

## 2017-12-13 DIAGNOSIS — M5124 Other intervertebral disc displacement, thoracic region: Secondary | ICD-10-CM | POA: Diagnosis not present

## 2017-12-20 DIAGNOSIS — M5124 Other intervertebral disc displacement, thoracic region: Secondary | ICD-10-CM | POA: Diagnosis not present

## 2017-12-20 DIAGNOSIS — M50223 Other cervical disc displacement at C6-C7 level: Secondary | ICD-10-CM | POA: Diagnosis not present

## 2017-12-20 DIAGNOSIS — M50323 Other cervical disc degeneration at C6-C7 level: Secondary | ICD-10-CM | POA: Diagnosis not present

## 2017-12-27 DIAGNOSIS — M50323 Other cervical disc degeneration at C6-C7 level: Secondary | ICD-10-CM | POA: Diagnosis not present

## 2017-12-27 DIAGNOSIS — M50223 Other cervical disc displacement at C6-C7 level: Secondary | ICD-10-CM | POA: Diagnosis not present

## 2017-12-27 DIAGNOSIS — M5124 Other intervertebral disc displacement, thoracic region: Secondary | ICD-10-CM | POA: Diagnosis not present

## 2018-01-03 DIAGNOSIS — M50223 Other cervical disc displacement at C6-C7 level: Secondary | ICD-10-CM | POA: Diagnosis not present

## 2018-01-03 DIAGNOSIS — M5124 Other intervertebral disc displacement, thoracic region: Secondary | ICD-10-CM | POA: Diagnosis not present

## 2018-01-03 DIAGNOSIS — M50323 Other cervical disc degeneration at C6-C7 level: Secondary | ICD-10-CM | POA: Diagnosis not present

## 2018-05-15 DIAGNOSIS — E7800 Pure hypercholesterolemia, unspecified: Secondary | ICD-10-CM | POA: Diagnosis not present

## 2018-05-15 DIAGNOSIS — J019 Acute sinusitis, unspecified: Secondary | ICD-10-CM | POA: Diagnosis not present

## 2018-05-15 DIAGNOSIS — Z23 Encounter for immunization: Secondary | ICD-10-CM | POA: Diagnosis not present

## 2018-05-15 DIAGNOSIS — Z0000 Encounter for general adult medical examination without abnormal findings: Secondary | ICD-10-CM | POA: Diagnosis not present

## 2018-05-15 DIAGNOSIS — Z79899 Other long term (current) drug therapy: Secondary | ICD-10-CM | POA: Diagnosis not present

## 2018-05-15 DIAGNOSIS — J209 Acute bronchitis, unspecified: Secondary | ICD-10-CM | POA: Diagnosis not present

## 2018-05-15 DIAGNOSIS — G4730 Sleep apnea, unspecified: Secondary | ICD-10-CM | POA: Diagnosis not present

## 2018-05-16 DIAGNOSIS — Z0000 Encounter for general adult medical examination without abnormal findings: Secondary | ICD-10-CM | POA: Diagnosis not present

## 2018-05-16 DIAGNOSIS — Z79899 Other long term (current) drug therapy: Secondary | ICD-10-CM | POA: Diagnosis not present

## 2018-06-12 DIAGNOSIS — E669 Obesity, unspecified: Secondary | ICD-10-CM | POA: Diagnosis not present

## 2018-06-29 DIAGNOSIS — E669 Obesity, unspecified: Secondary | ICD-10-CM | POA: Diagnosis not present

## 2018-07-19 DIAGNOSIS — E669 Obesity, unspecified: Secondary | ICD-10-CM | POA: Diagnosis not present

## 2018-08-02 DIAGNOSIS — Z86018 Personal history of other benign neoplasm: Secondary | ICD-10-CM | POA: Diagnosis not present

## 2018-08-02 DIAGNOSIS — L918 Other hypertrophic disorders of the skin: Secondary | ICD-10-CM | POA: Diagnosis not present

## 2018-08-02 DIAGNOSIS — D2261 Melanocytic nevi of right upper limb, including shoulder: Secondary | ICD-10-CM | POA: Diagnosis not present

## 2018-08-02 DIAGNOSIS — Z85828 Personal history of other malignant neoplasm of skin: Secondary | ICD-10-CM | POA: Diagnosis not present

## 2018-08-02 DIAGNOSIS — Z85820 Personal history of malignant melanoma of skin: Secondary | ICD-10-CM | POA: Diagnosis not present

## 2018-08-07 DIAGNOSIS — J209 Acute bronchitis, unspecified: Secondary | ICD-10-CM | POA: Diagnosis not present

## 2018-08-16 DIAGNOSIS — E669 Obesity, unspecified: Secondary | ICD-10-CM | POA: Diagnosis not present

## 2018-09-04 DIAGNOSIS — Z01419 Encounter for gynecological examination (general) (routine) without abnormal findings: Secondary | ICD-10-CM | POA: Diagnosis not present

## 2018-09-06 DIAGNOSIS — Z1212 Encounter for screening for malignant neoplasm of rectum: Secondary | ICD-10-CM | POA: Diagnosis not present

## 2018-09-06 DIAGNOSIS — Z1211 Encounter for screening for malignant neoplasm of colon: Secondary | ICD-10-CM | POA: Diagnosis not present

## 2018-09-08 ENCOUNTER — Other Ambulatory Visit: Payer: Self-pay | Admitting: Obstetrics and Gynecology

## 2018-09-08 DIAGNOSIS — Z1231 Encounter for screening mammogram for malignant neoplasm of breast: Secondary | ICD-10-CM

## 2018-09-20 DIAGNOSIS — E669 Obesity, unspecified: Secondary | ICD-10-CM | POA: Diagnosis not present

## 2018-09-25 DIAGNOSIS — R102 Pelvic and perineal pain: Secondary | ICD-10-CM | POA: Diagnosis not present

## 2018-10-11 ENCOUNTER — Ambulatory Visit
Admission: RE | Admit: 2018-10-11 | Discharge: 2018-10-11 | Disposition: A | Discharge status: Home / Self Care | Payer: 59 | Source: Ambulatory Visit | Attending: Obstetrics and Gynecology | Admitting: Obstetrics and Gynecology

## 2018-10-11 DIAGNOSIS — Z1231 Encounter for screening mammogram for malignant neoplasm of breast: Secondary | ICD-10-CM | POA: Diagnosis not present

## 2018-10-19 DIAGNOSIS — H5213 Myopia, bilateral: Secondary | ICD-10-CM | POA: Diagnosis not present

## 2018-10-19 DIAGNOSIS — H52203 Unspecified astigmatism, bilateral: Secondary | ICD-10-CM | POA: Diagnosis not present

## 2019-06-04 ENCOUNTER — Other Ambulatory Visit: Payer: Self-pay

## 2019-06-04 ENCOUNTER — Ambulatory Visit (INDEPENDENT_AMBULATORY_CARE_PROVIDER_SITE_OTHER): Payer: 59

## 2019-06-04 ENCOUNTER — Ambulatory Visit: Payer: 59 | Admitting: Podiatry

## 2019-06-04 ENCOUNTER — Encounter: Payer: Self-pay | Admitting: Podiatry

## 2019-06-04 ENCOUNTER — Other Ambulatory Visit: Payer: Self-pay | Admitting: Podiatry

## 2019-06-04 VITALS — BP 133/80 | HR 66 | Resp 16

## 2019-06-04 DIAGNOSIS — M79672 Pain in left foot: Secondary | ICD-10-CM | POA: Diagnosis not present

## 2019-06-04 DIAGNOSIS — M7752 Other enthesopathy of left foot: Secondary | ICD-10-CM

## 2019-06-04 DIAGNOSIS — M779 Enthesopathy, unspecified: Secondary | ICD-10-CM

## 2019-06-04 DIAGNOSIS — M79671 Pain in right foot: Secondary | ICD-10-CM

## 2019-06-04 DIAGNOSIS — G629 Polyneuropathy, unspecified: Secondary | ICD-10-CM | POA: Diagnosis not present

## 2019-06-04 DIAGNOSIS — M7751 Other enthesopathy of right foot: Secondary | ICD-10-CM

## 2019-06-04 NOTE — Progress Notes (Signed)
Subjective:   Patient ID: Ann Davenport, female   DOB: 53 y.o.   MRN: [id number]   HPI Patient states she is had a lot of pain in her feet in general and it is intensified over the last month and does have family history of neuropathy.  Patient does not remember specific injury and it is been worsening recently but somewhat tender over the last few months   Review of Systems  All other systems reviewed and are negative.       Objective:  Physical Exam Vitals signs and nursing note reviewed.  Constitutional:      Appearance: She is well-developed.  Pulmonary:     Effort: Pulmonary effort is normal.  Musculoskeletal: Normal range of motion.  Skin:    General: Skin is warm.  Neurological:     Mental Status: She is alert.     Neurovascular status intact muscle strength found to be adequate range of motion within normal limits with patient noted to have mild to moderate discomfort in the lesser MPJs bilateral with inflammation fluid buildup and is also noted to have diminishment of sharp dull vibratory bilateral     Assessment:  Possibility for acute inflammatory condition with underlying neuropathic pathology which could be part of the problem     Plan:  H&P educated her on this and we will get a start Aleve 4 per 2 twice a day along with orthotic casting which are done today to try to support her feet better and patient will be seen back when ready and I also discussed possibility for other treatments in future but I do not see any structural problems  X-rays were negative for signs of fracture or bony pathology

## 2019-06-04 NOTE — Progress Notes (Signed)
   Subjective:    Patient ID: Ann Davenport, female    DOB: [birth year], 53 y.o.   MRN: [id number]  HPI    Review of Systems  All other systems reviewed and are negative.      Objective:   Physical Exam        Assessment & Plan:

## 2019-07-05 ENCOUNTER — Other Ambulatory Visit: Payer: Self-pay

## 2019-07-05 ENCOUNTER — Ambulatory Visit (INDEPENDENT_AMBULATORY_CARE_PROVIDER_SITE_OTHER): Payer: 59 | Admitting: Orthotics

## 2019-07-05 DIAGNOSIS — G629 Polyneuropathy, unspecified: Secondary | ICD-10-CM

## 2019-07-05 DIAGNOSIS — M779 Enthesopathy, unspecified: Secondary | ICD-10-CM

## 2019-07-05 NOTE — Progress Notes (Signed)
Patient came in today to pick up custom made foot orthotics.  The goals were accomplished and the patient reported no dissatisfaction with said orthotics.  Patient was advised of breakin period and how to report any issues. 

## 2019-09-13 ENCOUNTER — Other Ambulatory Visit: Payer: Self-pay

## 2019-09-13 ENCOUNTER — Ambulatory Visit: Payer: 59 | Admitting: Orthotics

## 2019-09-13 DIAGNOSIS — M779 Enthesopathy, unspecified: Secondary | ICD-10-CM

## 2019-09-13 DIAGNOSIS — G629 Polyneuropathy, unspecified: Secondary | ICD-10-CM

## 2019-09-13 NOTE — Progress Notes (Signed)
Removed lift to left f/o (address lld); this done b/c the patient said lift was causing her back pain.

## 2019-09-22 IMAGING — MG DIGITAL SCREENING BILATERAL MAMMOGRAM WITH TOMO AND CAD
8 series · 8 of 24 positions shown · non-contrast
Comparison: Previous exam(s).

CLINICAL DATA: Screening.

EXAM:
DIGITAL SCREENING BILATERAL MAMMOGRAM WITH TOMO AND CAD

[R MLO synth-2D]
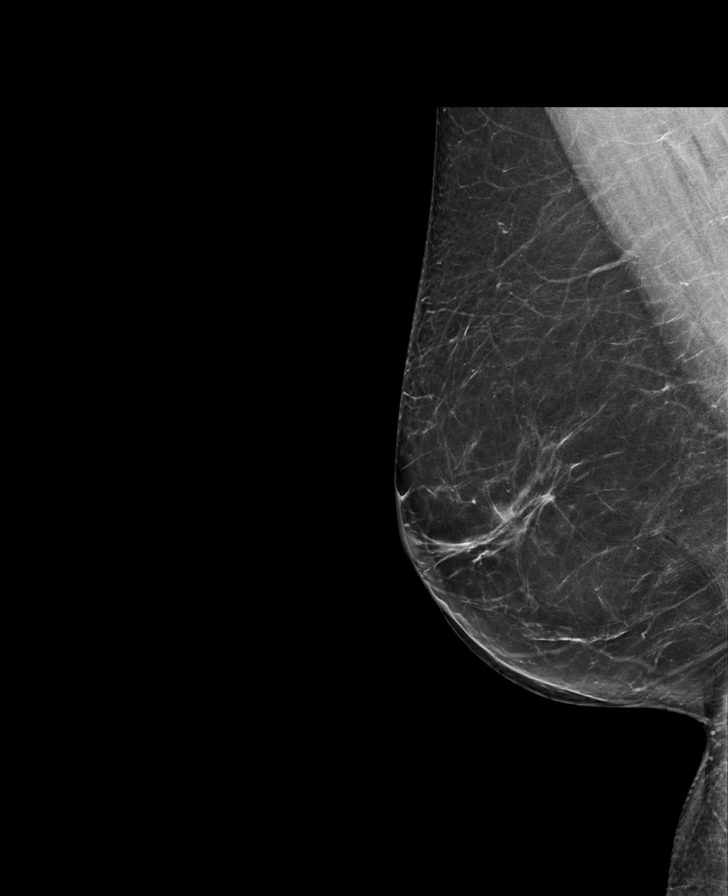

[L CC synth-2D]
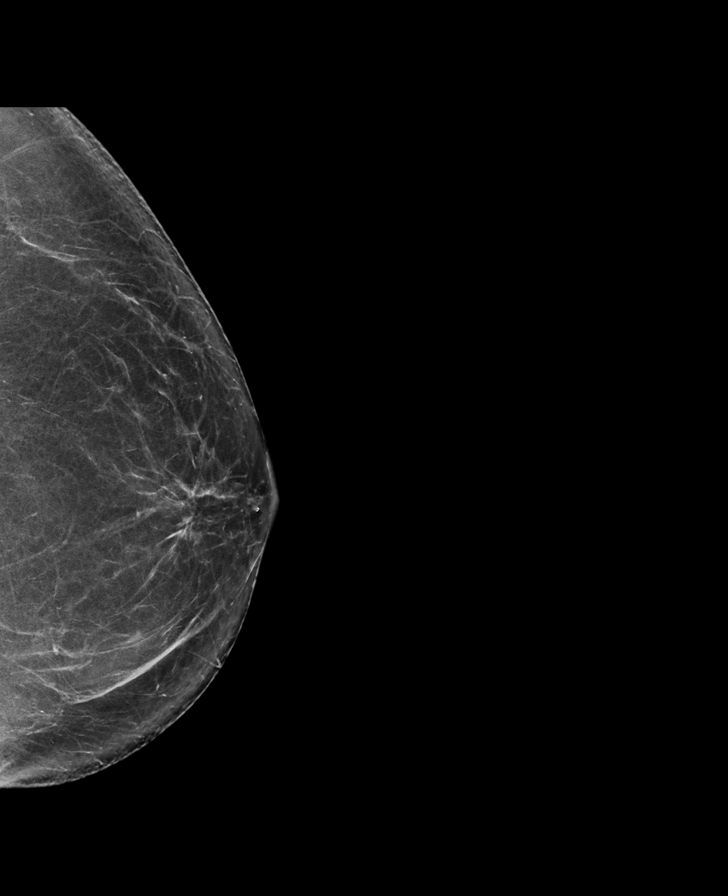

[L MLO synth-2D]
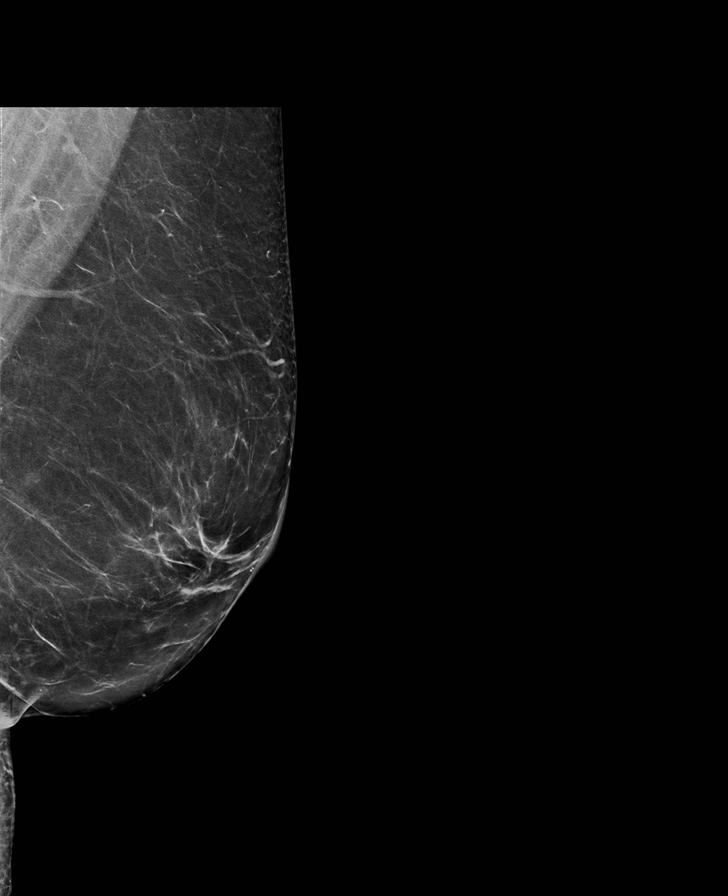

[R CC synth-2D]
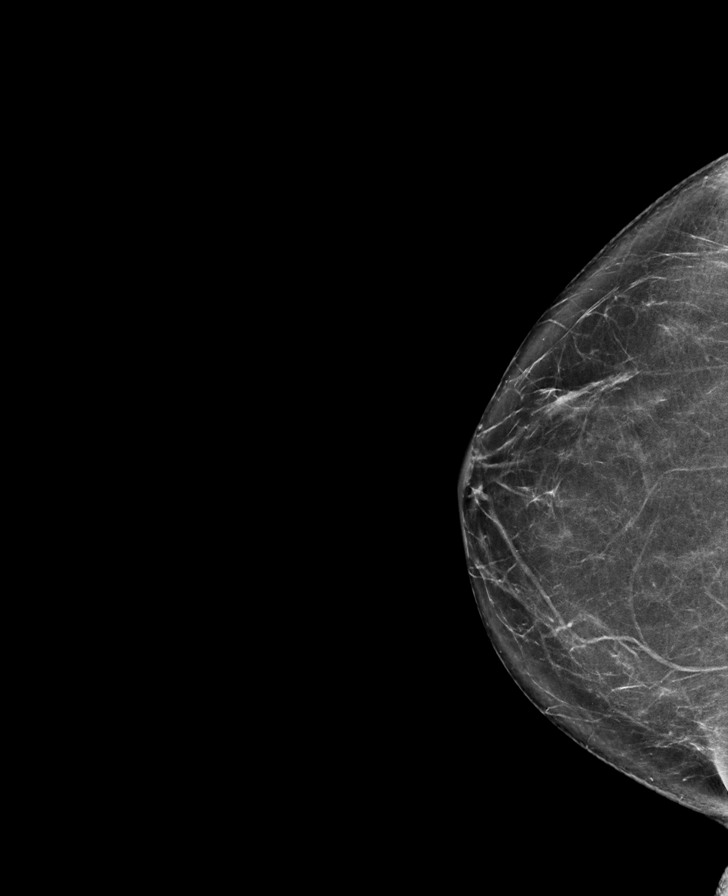

[L MLO tomo · tomo slice 41/80.0]
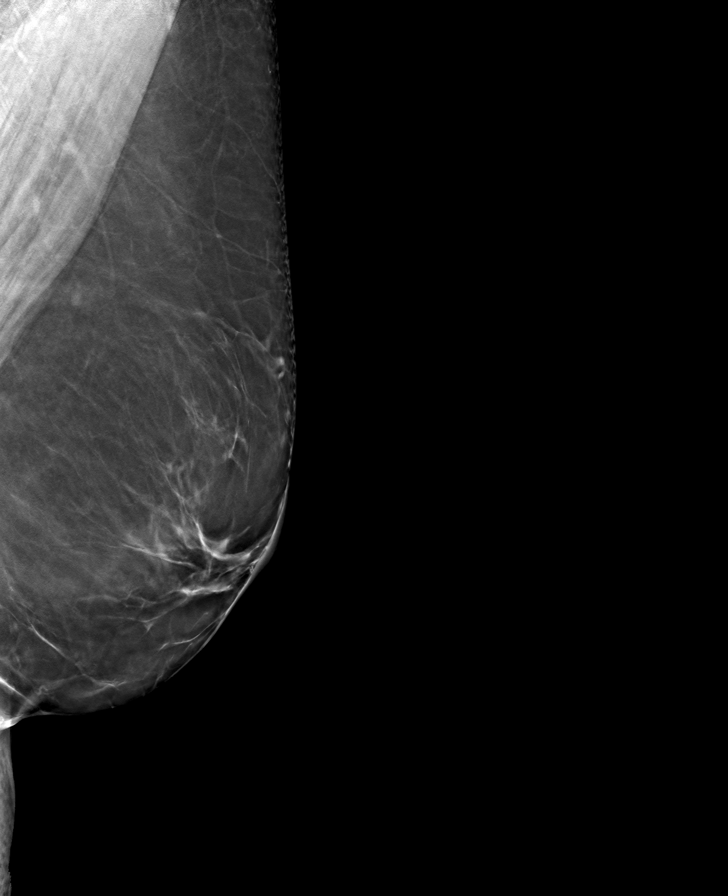

[R CC tomo · tomo slice 37/74.0]
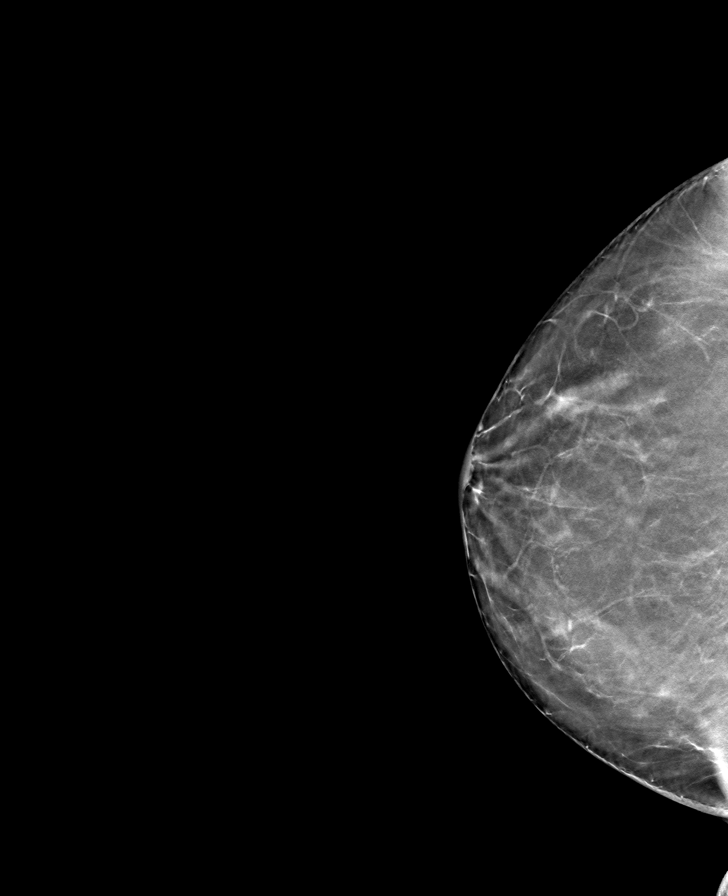

[L CC tomo · tomo slice 39/76.0]
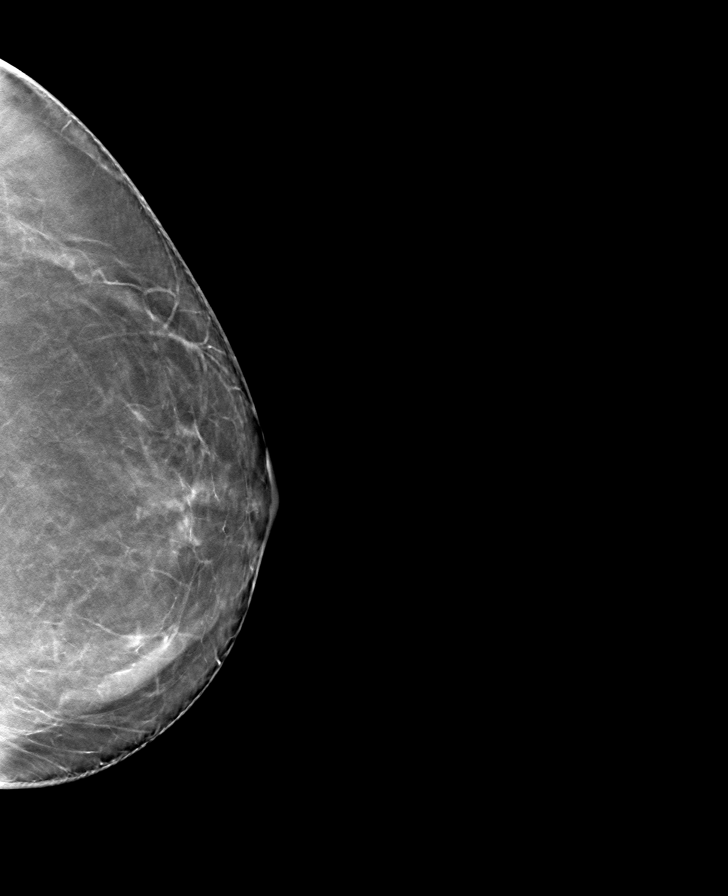

[R MLO tomo · tomo slice 41/82.0]
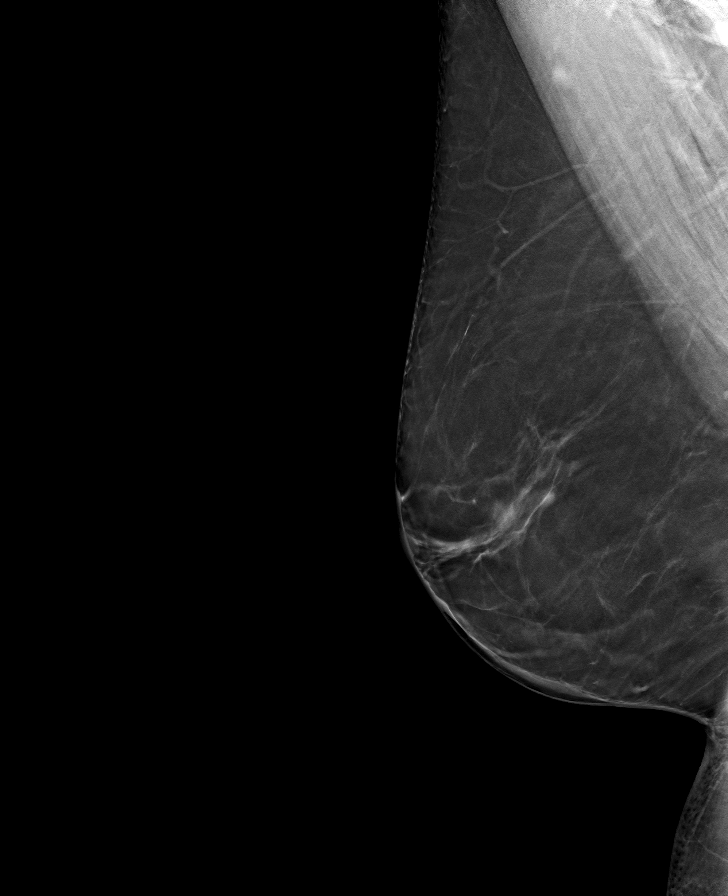

[8 of 24 positions shown; findings below may reference images not displayed]

ACR Breast Density Category b: There are scattered areas of
fibroglandular density.
FINDINGS: There are no findings suspicious for malignancy. Images were
processed with CAD.
IMPRESSION: No mammographic evidence of malignancy. A result letter of this
screening mammogram will be mailed directly to the patient.

RECOMMENDATION:
Screening mammogram in one year. (Code:[id number])

BI-RADS CATEGORY  1: Negative.

## 2019-12-12 ENCOUNTER — Other Ambulatory Visit: Payer: Self-pay | Admitting: Internal Medicine

## 2019-12-12 DIAGNOSIS — Z1231 Encounter for screening mammogram for malignant neoplasm of breast: Secondary | ICD-10-CM

## 2019-12-18 ENCOUNTER — Other Ambulatory Visit: Payer: Self-pay

## 2019-12-18 ENCOUNTER — Ambulatory Visit
Admission: RE | Admit: 2019-12-18 | Discharge: 2019-12-18 | Disposition: A | Discharge status: Home / Self Care | Payer: 59 | Source: Ambulatory Visit | Attending: Internal Medicine | Admitting: Internal Medicine

## 2019-12-18 DIAGNOSIS — Z1231 Encounter for screening mammogram for malignant neoplasm of breast: Secondary | ICD-10-CM

## 2020-06-25 ENCOUNTER — Other Ambulatory Visit: Payer: Self-pay | Admitting: Internal Medicine

## 2020-06-25 ENCOUNTER — Ambulatory Visit
Admission: RE | Admit: 2020-06-25 | Discharge: 2020-06-25 | Disposition: A | Discharge status: Home / Self Care | Payer: 59 | Source: Ambulatory Visit | Attending: Internal Medicine | Admitting: Internal Medicine

## 2020-06-25 DIAGNOSIS — J9811 Atelectasis: Secondary | ICD-10-CM

## 2020-12-17 ENCOUNTER — Other Ambulatory Visit: Payer: Self-pay | Admitting: Internal Medicine

## 2020-12-17 DIAGNOSIS — Z1231 Encounter for screening mammogram for malignant neoplasm of breast: Secondary | ICD-10-CM

## 2020-12-24 ENCOUNTER — Other Ambulatory Visit: Payer: Self-pay | Admitting: Internal Medicine

## 2020-12-24 DIAGNOSIS — N644 Mastodynia: Secondary | ICD-10-CM

## 2021-01-29 ENCOUNTER — Ambulatory Visit
Admission: RE | Admit: 2021-01-29 | Discharge: 2021-01-29 | Disposition: A | Discharge status: Home / Self Care | Payer: 59 | Source: Ambulatory Visit | Attending: Internal Medicine | Admitting: Internal Medicine

## 2021-01-29 ENCOUNTER — Other Ambulatory Visit: Payer: Self-pay

## 2021-01-29 ENCOUNTER — Ambulatory Visit: Payer: 59

## 2021-01-29 DIAGNOSIS — N644 Mastodynia: Secondary | ICD-10-CM

## 2021-09-10 ENCOUNTER — Other Ambulatory Visit (HOSPITAL_BASED_OUTPATIENT_CLINIC_OR_DEPARTMENT_OTHER): Payer: Self-pay

## 2021-09-10 ENCOUNTER — Ambulatory Visit: Payer: 59 | Attending: Internal Medicine

## 2021-09-10 DIAGNOSIS — Z23 Encounter for immunization: Secondary | ICD-10-CM

## 2021-09-10 MED ORDER — MODERNA COVID-19 BIVAL BOOSTER 50 MCG/0.5ML IM SUSP
INTRAMUSCULAR | 0 refills | Status: AC
  Filled 2021-09-10: qty 0.5, 1d supply, fill #0

## 2021-09-10 NOTE — Progress Notes (Signed)
° °  Covid-19 Vaccination Clinic  Name:  Ann Davenport    MRN: [id number] DOB: March 30, 1966  09/10/2021  [name] was observed post Covid-19 immunization for 15 minutes without incident. She was provided with Vaccine Information Sheet and instruction to access the V-Safe system.   [name] was instructed to call 911 with any severe reactions post vaccine: Difficulty breathing  Swelling of face and throat  A fast heartbeat  A bad rash all over body  Dizziness and weakness   Immunizations Administered     Name Date Dose VIS Date Route   Moderna Covid-19 vaccine Bivalent Booster 09/10/2021 11:41 AM 0.5 mL 04/11/2021 Intramuscular   Manufacturer: Moderna   Lot: [id number]   [place]: [id number]

## 2022-03-11 ENCOUNTER — Other Ambulatory Visit: Payer: Self-pay | Admitting: Internal Medicine

## 2022-03-11 DIAGNOSIS — Z1231 Encounter for screening mammogram for malignant neoplasm of breast: Secondary | ICD-10-CM

## 2022-03-24 ENCOUNTER — Ambulatory Visit
Admission: RE | Admit: 2022-03-24 | Discharge: 2022-03-24 | Disposition: A | Discharge status: Home / Self Care | Payer: 59 | Source: Ambulatory Visit | Attending: Internal Medicine | Admitting: Internal Medicine

## 2022-03-24 DIAGNOSIS — Z1231 Encounter for screening mammogram for malignant neoplasm of breast: Secondary | ICD-10-CM

## 2022-06-07 ENCOUNTER — Other Ambulatory Visit: Payer: Self-pay | Admitting: Urology

## 2022-06-16 NOTE — Patient Instructions (Signed)
SURGICAL WAITING ROOM VISITATION Patients having surgery or a procedure may have no more than 2 support people in the waiting area - these visitors may rotate.   Children under the age of 96 must have an adult with them who is not the patient. If the patient needs to stay at the hospital during part of their recovery, the visitor guidelines for inpatient rooms apply. Pre-op nurse will coordinate an appropriate time for 1 support person to accompany patient in pre-op.  This support person may not rotate.    Please refer to the [facility] website for the visitor guidelines for Inpatients (after your surgery is over and you are in a regular room).    Your procedure is scheduled on: 06/21/22   Report to [facility] Main Entrance    Report to admitting at 8:30 AM   Call this number if you have problems the morning of surgery [phone]   Do not eat food or liquids :After Midnight.          If you have questions, please contact your surgeon's office.   FOLLOW BOWEL PREP AND ANY ADDITIONAL PRE OP INSTRUCTIONS YOU RECEIVED FROM YOUR SURGEON'S OFFICE!!!     Oral Hygiene is also important to reduce your risk of infection.                                    Remember - BRUSH YOUR TEETH THE MORNING OF SURGERY WITH YOUR REGULAR TOOTHPASTE   Take these medicines the morning of surgery with A SIP OF WATER: Inhalers, Fluoxetine, Claritin, Meclizine              You may not have any metal on your body including hair pins, jewelry, and body piercing             Do not wear make-up, lotions, powders, perfumes, or deodorant  Do not wear nail polish including gel and S&S, artificial/acrylic nails, or any other type of covering on natural nails including finger and toenails. If you have artificial nails, gel coating, etc. that needs to be removed by a nail salon please have this removed prior to surgery or surgery may need to be canceled/ delayed if the surgeon/ anesthesia feels like they are  unable to be safely monitored.   Do not shave  48 hours prior to surgery.    Do not bring valuables to the hospital. [place].  DO NOT [employer]. PHARMACY WILL DISPENSE MEDICATIONS LISTED ON YOUR MEDICATION LIST TO YOU DURING YOUR ADMISSION [place]!    Patients discharged on the day of surgery will not be allowed to drive home.  Someone NEEDS to stay with you for the first 24 hours after anesthesia.   Special Instructions: Bring a copy of your healthcare power of attorney and living will documents the day of surgery if you haven't scanned them before.              Please read over the following fact sheets you were given: IF [place] [phone] Schneiders   If you received a COVID test during your pre-op visit  it is requested that you wear a mask when out in public, stay away from anyone that may not be  feeling well and notify your surgeon if you develop symptoms. If you test positive for Covid or have been in contact with anyone that has tested positive in the last 10 days please notify you surgeon.     [facility] - Preparing for Surgery Before surgery, you can play an important role.  Because skin is not sterile, your skin needs to be as free of germs as possible.  You can reduce the number of germs on your skin by washing with CHG (chlorahexidine gluconate) soap before surgery.  CHG is an antiseptic cleaner which kills germs and bonds with the skin to continue killing germs even after washing. Please DO NOT use if you have an allergy to CHG or antibacterial soaps.  If your skin becomes reddened/irritated stop using the CHG and inform your nurse when you arrive at Short Stay. Do not shave (including legs and underarms) for at least 48 hours prior to the first CHG shower.  You may shave your face/neck.  Please follow these instructions  carefully:  1.  Shower with CHG Soap the night before surgery and the  morning of surgery.  2.  If you choose to wash your hair, wash your hair first as usual with your normal  shampoo.  3.  After you shampoo, rinse your hair and body thoroughly to remove the shampoo.                             4.  Use CHG as you would any other liquid soap.  You can apply chg directly to the skin and wash.  Gently with a scrungie or clean washcloth.  5.  Apply the CHG Soap to your body ONLY FROM THE NECK DOWN.   Do   not use on face/ open                           Wound or open sores. Avoid contact with eyes, ears mouth and   genitals (private parts).                       Wash face,  Genitals (private parts) with your normal soap.             6.  Wash thoroughly, paying special attention to the area where your    surgery  will be performed.  7.  Thoroughly rinse your body with warm water from the neck down.  8.  DO NOT shower/wash with your normal soap after using and rinsing off the CHG Soap.                9.  Pat yourself dry with a clean towel.            10.  Wear clean pajamas.            11.  Place clean sheets on your bed the night of your first shower and do not  sleep with pets. Day of Surgery : Do not apply any lotions/deodorants the morning of surgery.  Please wear clean clothes to the hospital/surgery center.  FAILURE TO FOLLOW THESE INSTRUCTIONS MAY RESULT IN THE CANCELLATION OF YOUR SURGERY  PATIENT SIGNATURE_________________________________  NURSE SIGNATURE__________________________________  ________________________________________________________________________

## 2022-06-16 NOTE — Progress Notes (Addendum)
COVID Vaccine Completed: yes  Date of COVID positive in last 90 days: no  PCP - Marden Noble, MD Cardiologist - n/a  Chest x-ray - n/a EKG - n/a Stress Test - n/a ECHO -  n/a Cardiac Cath - n/a Pacemaker/ICD device last checked: n/a Spinal Cord Stimulator: n/a  Bowel Prep - no  Sleep Study - yes, mild CPAP - no  Fasting Blood Sugar - n/a Checks Blood Sugar _____ times a day  Blood Thinner Instructions: n/a Aspirin Instructions: Last Dose:  Activity level: Can go up a flight of stairs and perform activities of daily living without stopping and without symptoms of chest pain or shortness of breath.    Anesthesia review: sinus infection pt states surgeon is aware   Patient denies shortness of breath, fever, cough and chest pain at PAT appointment  Patient verbalized understanding of instructions that were given to them at the PAT appointment. Patient was also instructed that they will need to review over the PAT instructions again at home before surgery.

## 2022-06-17 ENCOUNTER — Encounter (HOSPITAL_COMMUNITY)
Admission: RE | Admit: 2022-06-17 | Discharge: 2022-06-17 | Disposition: A | Discharge status: Home / Self Care | Payer: 59 | Source: Ambulatory Visit | Attending: Urology | Admitting: Urology

## 2022-06-17 ENCOUNTER — Encounter (HOSPITAL_COMMUNITY): Payer: Self-pay

## 2022-06-17 VITALS — BP 149/96 | HR 100 | Temp 99.0°F | Resp 16 | Ht 64.5 in | Wt 158.0 lb

## 2022-06-17 DIAGNOSIS — Z01818 Encounter for other preprocedural examination: Secondary | ICD-10-CM | POA: Insufficient documentation

## 2022-06-17 HISTORY — DX: Malignant (primary) neoplasm, unspecified: C80.1

## 2022-06-17 LAB — CBC
HCT: 44.1 % (ref 36.0–46.0)
Hemoglobin: 14.6 g/dL (ref 12.0–15.0)
MCH: 31.3 pg (ref 26.0–34.0)
MCHC: 33.1 g/dL (ref 30.0–36.0)
MCV: 94.4 fL (ref 80.0–100.0)
Platelets: 260 10*3/uL (ref 150–400)
RBC: 4.67 MIL/uL (ref 3.87–5.11)
RDW: 12.8 % (ref 11.5–15.5)
WBC: 6 10*3/uL (ref 4.0–10.5)
nRBC: 0 % (ref 0.0–0.2)

## 2022-06-18 NOTE — H&P (Signed)
Office Visit Report     06/02/2022   --------------------------------------------------------------------------------   Ann Davenport  MRN: [id number]  DOB: [birth year], 56 year old Female  SSN:    PRIMARY CARE:  R Marcellus Scott, MD  REFERRING:  R Marcellus Scott, MD  PROVIDER:  Raynelle Bring, M.D.  LOCATION:  [facility] [phone]     --------------------------------------------------------------------------------   CC/HPI: Bladder cancer   Ann Davenport is a 56 year old female who had presented to [facility] urology in the summer 2021 with vaginal bleeding versus hematuria. She subsequently was diagnosed on cystoscopy with a 2 cm right-sided bladder tumor. On 03/04/2020, she underwent transurethral resection of a bladder tumor and pathology indicated that this was a low-grade, solitary TA tumor. Upper tract imaging with a CT scan was negative for upper tract disease. She underwent surveillance cystoscopy in October 2021 that was negative. Surveillance cystoscopy July 2022 was also negative. In July 2023, her primary urologist had left and she was seen by a different urologist who saw findings possibly concerning for recurrence. A bladder washing for cytology was negative at that time and he recommended follow-up cystoscopy in the fall. [name] decided to change her urologic provider at that time and follows up today for further discussion. She denies any recent hematuria.     ALLERGIES:  Cipro Epinephrine Erythromycin Latex Metronidazole Hcl Niacin Sulfa Sulindac    MEDICATIONS: Claritin 10 mg tablet  Probiotic  Prozac 20 mg capsule     GU PSH: Hysterectomy     NON-GU PSH: Breast Surgery Procedure Carpal tunnel surgery Cataract surgery Knee Arthroscopy/surgery     GU PMH: History of bladder cancer      PMH Notes:   1) Bladder cancer: She presented to Dr. Zigmund Daniel at [facility] in the summer of 2021 with vaginal bleeding vs hematuria and was  found to have a 2 cm bladder tumor just lateral to the right ureteral orifice. TURBT was performed on 03/04/20 confirming a low grade, Ta urothelial carcinoma. Subsequent cystoscopic follow up were unremarkable until July 2023 when concern was raised about findings possibly suspicious for CIS but cytology was negative.   Jul 2021: Low grade, Ta, solitary 2 cm tumor (low risk)   NON-GU PMH: Asthma    FAMILY HISTORY: skin cancer - Father   SOCIAL HISTORY: Marital Status: Single Preferred Language: English; Ethnicity: Not Hispanic Or Latino; Race: White Current Smoking Status: Patient does not smoke anymore.   Tobacco Use Assessment Completed: Used Tobacco in last 30 days? Social Drinker.  Drinks 1 caffeinated drink per day.    REVIEW OF SYSTEMS:    GU Review Female:   Patient denies frequent urination, hard to postpone urination, burning /pain with urination, get up at night to urinate, leakage of urine, stream starts and stops, trouble starting your stream, have to strain to urinate, and currently pregnant.  Gastrointestinal (Upper):   Patient denies nausea and vomiting.  Gastrointestinal (Lower):   Patient denies constipation and diarrhea.  Constitutional:   Patient reports fatigue. Patient denies fever, night sweats, and weight loss.  Skin:   Patient denies skin rash/ lesion and itching.  Eyes:   Patient denies blurred vision and double vision.  Ears/ Nose/ Throat:   Patient denies sore throat and sinus problems.  Hematologic/Lymphatic:   Patient reports easy bruising. Patient denies swollen glands.  Cardiovascular:   Patient denies leg swelling and chest pains.  Respiratory:   Patient denies cough and shortness of breath.  Endocrine:  Patient denies excessive thirst.  Musculoskeletal:   Patient denies back pain and joint pain.  Neurological:   Patient denies headaches and dizziness.  Psychologic:   Patient denies depression and anxiety.   VITAL SIGNS:      06/02/2022 09:16 AM   Weight 161 lb / 73.03 kg  Height 65 in / 165.1 cm  BP 144/91 mmHg  Pulse 82 /min  Temperature 97.7 F / 36.5 C  BMI 26.8 kg/m   GU PHYSICAL EXAMINATION:    Urethra: No tenderness, no mass, no scarring. No hypermobility. No leakage.   MULTI-SYSTEM PHYSICAL EXAMINATION:    Constitutional: Well-nourished. No physical deformities. Normally developed. Good grooming.     Complexity of Data:  Records Review:   Previous Patient Records   PROCEDURES:         Flexible Cystoscopy - 52000  Indication: History of bladder cancer  Risks, benefits, and some of the potential complications of the procedure were discussed at length with the patient including infection, bleeding, voiding discomfort, urinary retention, fever, chills, sepsis, and others. All questions were answered. Informed consent was obtained. Antibiotic prophylaxis was given. Sterile technique and intraurethral analgesia were used.  Meatus:  Normal size. Normal location. Normal condition.  Urethra:  No hypermobility. No leakage.  Ureteral Orifices:  Normal location. Normal size. Normal shape. Effluxed clear urine.  Bladder:  Systematic examination of the bladder does reveal small velvety/papillary areas along the right bladder neck and down toward the right hemitrigone. These are not highly suspicious for tumor but are abnormal. A bladder washing was obtained for cytology.      Chaperone: SM The lower urinary tract was carefully examined. The procedure was well-tolerated and without complications. Antibiotic instructions were given. Instructions were given to call the office immediately for bloody urine, difficulty urinating, urinary retention, painful or frequent urination, fever, chills, nausea, vomiting or other illness. The patient stated that she understood these instructions and would comply with them.         Urinalysis Dipstick Dipstick Cont'd  Color: Yellow Bilirubin: Neg mg/dL  Appearance: Clear Ketones: Neg mg/dL   Specific Gravity: 1.020 Blood: Neg ery/uL  pH: 6.5 Protein: Neg mg/dL  Glucose: Neg mg/dL Urobilinogen: 0.2 mg/dL    Nitrites: Neg    Leukocyte Esterase: Neg leu/uL    ASSESSMENT:      ICD-10 Details  1 GU:   Bladder Cancer overlapping sites - C67.8    PLAN:           Orders Labs Urine Cytology  Lab Notes: cytology after cysto          Schedule Return Visit/Planned Activity: Other See Visit Notes             Note: Will call to schedule surgery.          Document Letter(s):  Created for Patient: Clinical Summary         Notes:   1. Low risk bladder cancer: We discussed her cystoscopic findings today which appears similar to the description of the findings in July. Although her findings are not highly suspicious for tumor recurrence, they are abnormal and do warrant further evaluation with biopsy. A repeat bladder washing for cytology has been obtained today. She will be scheduled for cystoscopy and bladder biopsies. We have discussed the potential risks, complications, and the expected recovery process. This will be scheduled for the near future.    * Signed by Raynelle Bring, M.D. on 06/02/22 at 4:31 PM (EDT*

## 2022-06-21 DIAGNOSIS — Z01818 Encounter for other preprocedural examination: Secondary | ICD-10-CM

## 2022-06-21 DIAGNOSIS — J329 Chronic sinusitis, unspecified: Secondary | ICD-10-CM

## 2022-06-21 HISTORY — DX: Chronic sinusitis, unspecified: J32.9

## 2022-06-24 ENCOUNTER — Encounter (HOSPITAL_COMMUNITY): Payer: Self-pay | Admitting: Urology

## 2022-06-24 NOTE — Progress Notes (Signed)
Attempted to obtain medical history via telephone, unable to reach at this time. HIPAA compliant voicemail message left requesting return call to pre surgical testing department. 

## 2022-06-26 NOTE — Anesthesia Preprocedure Evaluation (Signed)
Anesthesia Evaluation  Patient identified by MRN, date of birth, ID band Patient awake    Reviewed: Allergy & Precautions, NPO status , Patient's Chart, lab work & pertinent test results  History of Anesthesia Complications (+) PONV  Airway Mallampati: II  TM Distance: >3 FB Neck ROM: Full    Dental  (+) Dental Advisory Given   Pulmonary sleep apnea (does not use CPAP) , Recent URI , Resolved, former smoker,    breath sounds clear to auscultation       Cardiovascular negative cardio ROS   Rhythm:Regular Rate:Normal     Neuro/Psych Anxiety Depression  Neuromuscular disease    GI/Hepatic negative GI ROS, Neg liver ROS,   Endo/Other  negative endocrine ROS  Renal/GU negative Renal ROS   Bladder CA    Musculoskeletal  (+) Fibromyalgia -  Abdominal   Peds  Hematology Lab Results      Component                Value               Date                          HGB                      14.6                06/17/2022                HCT                      44.1                06/17/2022               PLT                      260                 06/17/2022              Anesthesia Other Findings All: Erythromycin, Latex, sulfa  Reproductive/Obstetrics                           Anesthesia Physical Anesthesia Plan  ASA: 3  Anesthesia Plan: General   Post-op Pain Management: Tylenol PO (pre-op)* and Minimal or no pain anticipated   Induction: Intravenous  PONV Risk Score and Plan: 3 and Ondansetron, Dexamethasone and Treatment may vary due to age or medical condition  Airway Management Planned: LMA  Additional Equipment: None  Intra-op Plan:   Post-operative Plan:   Informed Consent: I have reviewed the patients History and Physical, chart, labs and discussed the procedure including the risks, benefits and alternatives for the proposed anesthesia with the patient or authorized  representative who has indicated his/her understanding and acceptance.     Dental advisory given  Plan Discussed with: CRNA and Surgeon  Anesthesia Plan Comments:        Anesthesia Quick Evaluation

## 2022-06-28 ENCOUNTER — Ambulatory Visit (HOSPITAL_COMMUNITY)
Admission: RE | Admit: 2022-06-28 | Discharge: 2022-06-28 | Disposition: A | Discharge status: Home / Self Care | Payer: 59 | Attending: Urology | Admitting: Urology

## 2022-06-28 ENCOUNTER — Ambulatory Visit (HOSPITAL_COMMUNITY): Payer: 59 | Admitting: Physician Assistant

## 2022-06-28 ENCOUNTER — Encounter (HOSPITAL_COMMUNITY): Payer: Self-pay | Admitting: Urology

## 2022-06-28 ENCOUNTER — Encounter (HOSPITAL_COMMUNITY)
Admission: RE | Disposition: A | Discharge status: Home / Self Care | Payer: Self-pay | Source: Home / Self Care | Attending: Urology

## 2022-06-28 ENCOUNTER — Ambulatory Visit (HOSPITAL_BASED_OUTPATIENT_CLINIC_OR_DEPARTMENT_OTHER): Payer: 59 | Admitting: Anesthesiology

## 2022-06-28 DIAGNOSIS — Z8551 Personal history of malignant neoplasm of bladder: Secondary | ICD-10-CM

## 2022-06-28 DIAGNOSIS — Z87891 Personal history of nicotine dependence: Secondary | ICD-10-CM | POA: Diagnosis not present

## 2022-06-28 DIAGNOSIS — N3289 Other specified disorders of bladder: Secondary | ICD-10-CM | POA: Diagnosis present

## 2022-06-28 DIAGNOSIS — N3020 Other chronic cystitis without hematuria: Secondary | ICD-10-CM | POA: Diagnosis not present

## 2022-06-28 DIAGNOSIS — G4730 Sleep apnea, unspecified: Secondary | ICD-10-CM

## 2022-06-28 DIAGNOSIS — F418 Other specified anxiety disorders: Secondary | ICD-10-CM

## 2022-06-28 DIAGNOSIS — Z01818 Encounter for other preprocedural examination: Secondary | ICD-10-CM

## 2022-06-28 HISTORY — PX: CYSTOSCOPY WITH BIOPSY: SHX5122

## 2022-06-28 SURGERY — CYSTOSCOPY, WITH BIOPSY
Anesthesia: General | Site: Bladder

## 2022-06-28 MED ORDER — DEXAMETHASONE SODIUM PHOSPHATE 10 MG/ML IJ SOLN
INTRAMUSCULAR | Status: AC
  Filled 2022-06-28: qty 1

## 2022-06-28 MED ORDER — PROPOFOL 10 MG/ML IV BOLUS
INTRAVENOUS | Status: DC | PRN
  Administered 2022-06-28: 100 mg via INTRAVENOUS
  Administered 2022-06-28: 150 mg via INTRAVENOUS

## 2022-06-28 MED ORDER — PHENAZOPYRIDINE HCL 200 MG PO TABS: 200.0000 mg | ORAL_TABLET | Freq: Three times a day (TID) | ORAL | 0 refills | Status: AC | PRN

## 2022-06-28 MED ORDER — FENTANYL CITRATE (PF) 100 MCG/2ML IJ SOLN
INTRAMUSCULAR | Status: AC
  Filled 2022-06-28: qty 2

## 2022-06-28 MED ORDER — STERILE WATER FOR IRRIGATION IR SOLN
Status: DC | PRN
  Administered 2022-06-28: 3000 mL

## 2022-06-28 MED ORDER — ONDANSETRON HCL 4 MG/2ML IJ SOLN: 4.0000 mg | Freq: Once | INTRAMUSCULAR | Status: DC | PRN

## 2022-06-28 MED ORDER — 0.9 % SODIUM CHLORIDE (POUR BTL) OPTIME
TOPICAL | Status: DC | PRN
  Administered 2022-06-28: 1000 mL

## 2022-06-28 MED ORDER — FENTANYL CITRATE (PF) 100 MCG/2ML IJ SOLN
INTRAMUSCULAR | Status: DC | PRN
  Administered 2022-06-28 (×2): 50 ug via INTRAVENOUS

## 2022-06-28 MED ORDER — CHLORHEXIDINE GLUCONATE 0.12 % MT SOLN
15.0000 mL | Freq: Once | OROMUCOSAL | Status: AC
  Administered 2022-06-28: 15 mL via OROMUCOSAL

## 2022-06-28 MED ORDER — LACTATED RINGERS IV SOLN: INTRAVENOUS | Status: DC

## 2022-06-28 MED ORDER — ACETAMINOPHEN 10 MG/ML IV SOLN: 1000.0000 mg | Freq: Once | INTRAVENOUS | Status: DC | PRN

## 2022-06-28 MED ORDER — ONDANSETRON HCL 4 MG/2ML IJ SOLN
INTRAMUSCULAR | Status: DC | PRN
  Administered 2022-06-28: 4 mg via INTRAVENOUS

## 2022-06-28 MED ORDER — PROPOFOL 10 MG/ML IV BOLUS
INTRAVENOUS | Status: AC
  Filled 2022-06-28: qty 20

## 2022-06-28 MED ORDER — DEXAMETHASONE SODIUM PHOSPHATE 4 MG/ML IJ SOLN
INTRAMUSCULAR | Status: DC | PRN
  Administered 2022-06-28: 10 mg via INTRAVENOUS

## 2022-06-28 MED ORDER — AMISULPRIDE (ANTIEMETIC) 5 MG/2ML IV SOLN: 10.0000 mg | Freq: Once | INTRAVENOUS | Status: DC | PRN

## 2022-06-28 MED ORDER — ORAL CARE MOUTH RINSE: 15.0000 mL | Freq: Once | OROMUCOSAL | Status: AC

## 2022-06-28 MED ORDER — CEFAZOLIN SODIUM-DEXTROSE 2-4 GM/100ML-% IV SOLN
2.0000 g | INTRAVENOUS | Status: AC
  Administered 2022-06-28: 2 g via INTRAVENOUS
  Filled 2022-06-28: qty 100

## 2022-06-28 MED ORDER — ONDANSETRON HCL 4 MG/2ML IJ SOLN
INTRAMUSCULAR | Status: AC
  Filled 2022-06-28: qty 2

## 2022-06-28 MED ORDER — HYDROMORPHONE HCL 1 MG/ML IJ SOLN: 0.2500 mg | INTRAMUSCULAR | Status: DC | PRN

## 2022-06-28 MED ORDER — OXYCODONE HCL 5 MG PO TABS: 5.0000 mg | ORAL_TABLET | Freq: Once | ORAL | Status: DC | PRN

## 2022-06-28 MED ORDER — OXYCODONE HCL 5 MG/5ML PO SOLN: 5.0000 mg | Freq: Once | ORAL | Status: DC | PRN

## 2022-06-28 MED ORDER — LIDOCAINE 2% (20 MG/ML) 5 ML SYRINGE
INTRAMUSCULAR | Status: DC | PRN
  Administered 2022-06-28: 40 mg via INTRAVENOUS

## 2022-06-28 SURGICAL SUPPLY — 16 items
BAG URINE DRAIN 2000ML AR STRL (UROLOGICAL SUPPLIES) IMPLANT
BAG URO CATCHER STRL LF (MISCELLANEOUS) ×1 IMPLANT
DRAPE FOOT SWITCH (DRAPES) ×1 IMPLANT
ELECT REM PT RETURN 15FT ADLT (MISCELLANEOUS) ×1 IMPLANT
GLOVE SURG LX STRL 7.5 STRW (GLOVE) ×1 IMPLANT
GOWN STRL REUS W/ TWL XL LVL3 (GOWN DISPOSABLE) ×1 IMPLANT
GOWN STRL REUS W/TWL XL LVL3 (GOWN DISPOSABLE) ×1
KIT TURNOVER KIT A (KITS) IMPLANT
LOOP CUT BIPOLAR 24F LRG (ELECTROSURGICAL) IMPLANT
MANIFOLD NEPTUNE II (INSTRUMENTS) ×1 IMPLANT
PACK CYSTO (CUSTOM PROCEDURE TRAY) ×1 IMPLANT
PENCIL SMOKE EVACUATOR (MISCELLANEOUS) IMPLANT
SYR TOOMEY IRRIG 70ML (MISCELLANEOUS)
SYRINGE TOOMEY IRRIG 70ML (MISCELLANEOUS) IMPLANT
TUBING CONNECTING 10 (TUBING) ×1 IMPLANT
TUBING UROLOGY SET (TUBING) ×1 IMPLANT

## 2022-06-28 NOTE — Interval H&P Note (Signed)
History and Physical Interval Note:  06/28/2022 1:00 PM  [employer]  has presented today for surgery, with the diagnosis of BLADDER TUMOR.  The various methods of treatment have been discussed with the patient and family. After consideration of risks, benefits and other options for treatment, the patient has consented to  Procedure(s) with comments: CYSTOSCOPY WITH BLADDER BIOPSY (N/A) - ONLY NEEDS 45 MIN as a surgical intervention.  The patient's history has been reviewed, patient examined, no change in status, stable for surgery.  I have reviewed the patient's chart and labs.  Questions were answered to the patient's satisfaction.     Les [employer]

## 2022-06-28 NOTE — Discharge Instructions (Signed)
You may see some blood in the urine and may have some burning with urination for 48-72 hours. You also may notice that you have to urinate more frequently or urgently after your procedure which is normal.  You should call should you develop an inability urinate, fever > 101, persistent nausea and vomiting that prevents you from eating or drinking to stay hydrated.    

## 2022-06-28 NOTE — Transfer of Care (Signed)
Immediate Anesthesia Transfer of Care Note  Patient: Ann Davenport  Procedure(s) Performed: CYSTOSCOPY WITH BLADDER BIOPSY (Bladder)  Patient Location: PACU  Anesthesia Type:General  Level of Consciousness: drowsy  Airway & Oxygen Therapy: Patient Spontanous Breathing and Patient connected to face mask  Post-op Assessment: Report given to RN and Post -op Vital signs reviewed and stable  Post vital signs: Reviewed and stable  Last Vitals:  Vitals Value Taken Time  BP 123/75 06/28/22 1406  Temp    Pulse 74 06/28/22 1408  Resp 17 06/28/22 1408  SpO2 93 % 06/28/22 1408  Vitals shown include unvalidated device data.  Last Pain:  Vitals:   06/28/22 1200  TempSrc:   PainSc: 2       Patients Stated Pain Goal: 4 (34/19/37 9024)  Complications: No notable events documented.

## 2022-06-28 NOTE — Anesthesia Procedure Notes (Signed)
Procedure Name: LMA Insertion Date/Time: 06/28/2022 1:32 PM  Performed by: Claudia Desanctis, CRNAPre-anesthesia Checklist: Emergency Drugs available, Patient identified, Suction available and Patient being monitored Patient Re-evaluated:Patient Re-evaluated prior to induction Oxygen Delivery Method: Circle system utilized Preoxygenation: Pre-oxygenation with 100% oxygen Induction Type: IV induction Ventilation: Mask ventilation without difficulty LMA: LMA inserted LMA Size: 4.0 Number of attempts: 1 Placement Confirmation: positive ETCO2 and breath sounds checked- equal and bilateral Tube secured with: Tape Dental Injury: Teeth and Oropharynx as per pre-operative assessment

## 2022-06-28 NOTE — Interval H&P Note (Signed)
History and Physical Interval Note:  06/28/2022 11:36 AM  Ann Davenport  has presented today for surgery, with the diagnosis of BLADDER TUMOR.  The various methods of treatment have been discussed with the patient and family. After consideration of risks, benefits and other options for treatment, the patient has consented to  Procedure(s) with comments: CYSTOSCOPY WITH BLADDER BIOPSY (N/A) - ONLY NEEDS 45 MIN as a surgical intervention.  The patient's history has been reviewed, patient examined, no change in status, stable for surgery.  I have reviewed the patient's chart and labs.  Questions were answered to the patient's satisfaction.     Les [employer]

## 2022-06-28 NOTE — Op Note (Signed)
Preoperative diagnosis: History of urothelial carcinoma the bladder and bladder lesion  Postoperative diagnosis: History of urothelial carcinoma of the bladder and bladder lesion  Procedure: 1.  Cystoscopy 2.  Bladder biopsies  Surgeon: Pryor Curia MD  Anesthesia: General  Complications: None  Specimens: Right-sided bladder biopsies  Disposition of specimen: Pathology  Indication Ann Davenport is a 56 year old female with a history of bladder cancer.  On recent surveillance cystoscopy, she was noted to have an erythematous area on the right side of her bladder that was potentially suspicious for recurrence or carcinoma in situ.  After reviewing these findings, she elected to proceed with the above procedures.  The potential risks, complications, and expected recovery process was discussed in detail.  Informed consent was obtained.  Description of procedure: The patient was taken to the operating room and a general anesthetic was administered.  She was given preoperative antibiotics, placed in the dorsolithotomy position, and prepped and draped in the usual sterile fashion.  Next, a preoperative timeout was performed.  Cystourethroscopy was then performed and a systematic examination of the bladder was carried out.  This demonstrated the ureteral orifice ease to be in their normal anatomic location and effluxing clear urine.  No distinct bladder tumors were identified.  On the right side of the bladder just medial and just lateral to the ureteral orifice, there were noted to be 2 erythematous flat lesions.  These were both biopsied with a cold cup biopsy forceps.  Hemostasis was then achieved with a Bugbee monopolar electrode.  Bladder was emptied and reinspected and hemostasis appeared excellent.  The patient tolerated the procedure well without complications.  She was able to be awakened and transferred to recovery unit in satisfactory condition.

## 2022-06-28 NOTE — Anesthesia Postprocedure Evaluation (Signed)
Anesthesia Post Note  Patient: Engineer, petroleum  Procedure(s) Performed: CYSTOSCOPY WITH BLADDER BIOPSY (Bladder)     Patient location during evaluation: Phase II Anesthesia Type: General Level of consciousness: awake and alert, patient cooperative and oriented Pain management: pain level controlled Vital Signs Assessment: post-procedure vital signs reviewed and stable Respiratory status: spontaneous breathing, nonlabored ventilation and respiratory function stable Cardiovascular status: blood pressure returned to baseline and stable Postop Assessment: no apparent nausea or vomiting and able to ambulate Anesthetic complications: no   No notable events documented.  Last Vitals:  Vitals:   06/28/22 1430 06/28/22 1445  BP: 91/67 110/72  Pulse: (!) 59 63  Resp: 17 18  Temp:    SpO2: 98% 95%    Last Pain:  Vitals:   06/28/22 1445  TempSrc:   PainSc: 0-No pain                 Antoin Dargis,E. Arina Torry

## 2022-06-29 ENCOUNTER — Encounter (HOSPITAL_COMMUNITY): Payer: Self-pay | Admitting: Urology

## 2022-06-29 LAB — SURGICAL PATHOLOGY

## 2022-11-10 LAB — EXTERNAL GENERIC LAB PROCEDURE: COLOGUARD: NEGATIVE

## 2022-11-10 LAB — COLOGUARD: COLOGUARD: NEGATIVE

## 2023-06-16 ENCOUNTER — Other Ambulatory Visit: Payer: Self-pay | Admitting: Internal Medicine

## 2023-06-16 DIAGNOSIS — Z1231 Encounter for screening mammogram for malignant neoplasm of breast: Secondary | ICD-10-CM

## 2023-07-21 ENCOUNTER — Ambulatory Visit: Payer: 59

## 2023-08-25 ENCOUNTER — Ambulatory Visit
Admission: RE | Admit: 2023-08-25 | Discharge: 2023-08-25 | Disposition: A | Discharge status: Home / Self Care | Payer: 59 | Source: Ambulatory Visit | Attending: Internal Medicine | Admitting: Internal Medicine

## 2023-08-25 DIAGNOSIS — Z1231 Encounter for screening mammogram for malignant neoplasm of breast: Secondary | ICD-10-CM

## 2023-09-15 ENCOUNTER — Telehealth: Payer: Self-pay

## 2023-09-15 NOTE — Telephone Encounter (Signed)
Informed pt on VM that we will have to rescan feet for new orthotics, dont have her old scans. And she needs to schedule appt

## 2023-10-12 ENCOUNTER — Other Ambulatory Visit (HOSPITAL_BASED_OUTPATIENT_CLINIC_OR_DEPARTMENT_OTHER): Payer: Self-pay | Admitting: Internal Medicine

## 2023-10-12 DIAGNOSIS — E7800 Pure hypercholesterolemia, unspecified: Secondary | ICD-10-CM

## 2023-10-19 ENCOUNTER — Ambulatory Visit: Payer: 59 | Admitting: Podiatry

## 2023-10-19 ENCOUNTER — Other Ambulatory Visit: Payer: 59

## 2023-10-20 ENCOUNTER — Other Ambulatory Visit (HOSPITAL_COMMUNITY): Payer: 59

## 2023-10-26 ENCOUNTER — Ambulatory Visit: Payer: 59 | Admitting: Podiatry

## 2023-10-26 ENCOUNTER — Encounter: Payer: Self-pay | Admitting: Podiatry

## 2023-10-26 DIAGNOSIS — G5763 Lesion of plantar nerve, bilateral lower limbs: Secondary | ICD-10-CM | POA: Diagnosis not present

## 2023-10-26 NOTE — Progress Notes (Signed)
   Chief Complaint  Patient presents with   Foot Orthotics    RM#8 Patient here to establish has a scheduled appt with Trish.    HPI: 58 y.o. female presenting today as a reestablish new patient for evaluation of chronic bilateral forefoot pain.  She was seen in the office here several years ago and orthotics were prescribed.  She says the orthotics helped significantly.  She is used the orthotics over the last 7 years and they are now worn out and she is requesting a new pair.  Without the orthotic she gets to experience symptoms of Morton's neuroma and she has a chronic bunion to the right foot.  Past Medical History:  Diagnosis Date   Allergy    Anxiety    Cancer (HCC)    bladder   Depression    Fibromyalgia    Neuromuscular disorder (HCC)    PONV (postoperative nausea and vomiting)    Rhinitis    Seasonal allergies    Sinus infection 06/21/2022   Sleep apnea    mild-no cpap-small nasal passage   Substance abuse (HCC)    recovered alcoholic    Past Surgical History:  Procedure Laterality Date   ABDOMINAL HYSTERECTOMY  2005   BREAST SURGERY  1988   breast reduction   CARPAL TUNNEL RELEASE Bilateral    CYSTOSCOPY WITH BIOPSY N/A 06/28/2022   Procedure: CYSTOSCOPY WITH BLADDER BIOPSY;  Surgeon: Heloise Purpura, MD;  Location: [facility];  Service: Urology;  Laterality: N/A;  ONLY NEEDS 45 MIN   KNEE ARTHROSCOPY Left    NASAL SEPTOPLASTY W/ TURBINOPLASTY Bilateral 07/05/2014   Procedure: NASAL SEPTOPLASTY WITH BILATERAL TURBINATE REDUCTION;  Surgeon: Serena Colonel, MD;  Location: [facility];  Service: ENT;  Laterality: Bilateral;   REDUCTION MAMMAPLASTY Bilateral    TONSILLECTOMY      Allergies  Allergen Reactions   Erythromycin Nausea And Vomiting   Latex Rash   Niacin And Related Rash    flushing   Sulfa Antibiotics Rash     Physical Exam: General: The patient is alert and oriented x3 in no acute distress.  Dermatology: Skin is warm, dry and supple  bilateral lower extremities.   Vascular: Palpable pedal pulses bilaterally. Capillary refill within normal limits.  No appreciable edema.  No erythema.  Neurological: Grossly intact via light touch  Musculoskeletal Exam: History of chronic Morton's neuroma to the bilateral feet.  Hallux valgus deformity also noted to the right foot.   Assessment/Plan of Care: 1.  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  Hallux valgus right  -Patient evaluated -Appointment is already scheduled with our orthotics department for custom molded orthotics to offload the forefoot and support the medial longitudinal arch.  Orthotics in the past and helped tremendously alleviate a lot of her symptoms and pain to the feet -Return to clinic with me as needed       Felecia Shelling, DPM [facility]  Dr. Felecia Shelling, DPM    [address] [place], [place] [address]                Office [phone]  Fax [phone]

## 2023-10-27 ENCOUNTER — Ambulatory Visit (HOSPITAL_COMMUNITY)
Admission: RE | Admit: 2023-10-27 | Discharge: 2023-10-27 | Disposition: A | Discharge status: Home / Self Care | Payer: Self-pay | Source: Ambulatory Visit | Attending: Internal Medicine | Admitting: Internal Medicine

## 2023-10-27 DIAGNOSIS — E7800 Pure hypercholesterolemia, unspecified: Secondary | ICD-10-CM | POA: Insufficient documentation

## 2023-11-23 ENCOUNTER — Ambulatory Visit: Payer: 59

## 2023-11-23 NOTE — Progress Notes (Signed)
 Orthotics   Patient was present and evaluated for Custom molded foot orthotics. Patient will benefit from CFO's to provide total contact to BIL MLA's helping to balance and distribute body weight more evenly across BIL feet helping to reduce plantar pressure and pain. Orthotic will also encourage FF / RF alignment  Patient was scanned today and will return for fitting upon receipt

## 2024-01-05 ENCOUNTER — Other Ambulatory Visit

## 2024-01-25 ENCOUNTER — Ambulatory Visit

## 2024-01-25 DIAGNOSIS — M2141 Flat foot [pes planus] (acquired), right foot: Secondary | ICD-10-CM

## 2024-01-25 DIAGNOSIS — G5763 Lesion of plantar nerve, bilateral lower limbs: Secondary | ICD-10-CM

## 2024-01-25 NOTE — Progress Notes (Signed)
 Patient presents today to pick up custom molded foot orthotics, diagnosed with mortons Neuroma by Dr. Luster Salters.   Orthotics were dispensed and fit was satisfactory. Reviewed instructions for break-in and wear. Written instructions given to patient.  Patient will follow up as needed.   Britton Cane Cped, CFo, CFm

## 2024-02-02 ENCOUNTER — Telehealth: Payer: Self-pay | Admitting: Podiatry

## 2024-02-02 ENCOUNTER — Ambulatory Visit

## 2024-02-02 NOTE — Telephone Encounter (Signed)
 Error

## 2024-02-02 NOTE — Progress Notes (Signed)
 Patient was in with orthotics states they feel too hard as she has Fobro myalgia and are giving her leg cramps  I re-ordered in accommodative with scaphoid pads added 3/4 length  These will be no charge

## 2024-02-17 ENCOUNTER — Telehealth: Payer: Self-pay

## 2024-02-17 NOTE — Telephone Encounter (Signed)
 LVM to schedule orthotic PU

## 2024-02-22 ENCOUNTER — Other Ambulatory Visit

## 2024-03-21 ENCOUNTER — Ambulatory Visit

## 2024-03-21 NOTE — Progress Notes (Signed)
 Patient was here to pick up and be fit with new re-made orthotics  Patient is much happier with fit and function states they feel good  Patient will call office If any problems arise  Lolita Schultze CPed, CFo, CFm

## 2024-07-05 ENCOUNTER — Other Ambulatory Visit: Payer: Self-pay | Admitting: Urology

## 2024-07-18 NOTE — Patient Instructions (Addendum)
 SURGICAL WAITING ROOM VISITATION Patients having surgery or a procedure may have no more than 2 support people in the waiting area - these visitors may rotate in the visitor waiting room.   Due to an increase in RSV and influenza rates and associated hospitalizations, children ages 50 and under may not visit patients in [facility] hospitals. If the patient needs to stay at the hospital during part of their recovery, the visitor guidelines for inpatient rooms apply.  PRE-OP VISITATION  Pre-op nurse will coordinate an appropriate time for 1 support person to accompany the patient in pre-op.  This support person may not rotate.  This visitor will be contacted when the time is appropriate for the visitor to come back in the pre-op area.  Please refer to the [facility] website for the visitor guidelines for Inpatients (after your surgery is over and you are in a regular room).  You are not required to quarantine at this time prior to your surgery. However, you must do this: Hand Hygiene often Do NOT share personal items Notify your provider if you are in close contact with someone who has COVID or you develop fever 100.4 or greater, new onset of sneezing, cough, sore throat, shortness of breath or body aches.  If you test positive for Covid or have been in contact with anyone that has tested positive in the last 10 days please notify you surgeon.    Your procedure is scheduled on:  07/30/24  Report to [facility] Main Entrance: [place] entrance where the [employer] is available.   Report to admitting at: 12:40 PM  Call this number if you have any questions or problems the morning of surgery [phone]  FOLLOW ANY ADDITIONAL PRE OP INSTRUCTIONS YOU RECEIVED FROM YOUR SURGEON'S OFFICE!!!  Do not eat food after Midnight the night prior to your surgery/procedure.  After Midnight you may have the following liquids until: 11:45 AM DAY OF SURGERY  Clear Liquid Diet Water  Black  Coffee (sugar ok, NO MILK/CREAM OR CREAMERS)  Tea (sugar ok, NO MILK/CREAM OR CREAMERS) regular and decaf                             Plain Jell-O  with no fruit (NO RED)                                           Fruit ices (not with fruit pulp, NO RED)                                     Popsicles (NO RED)                                                                  Juice: NO CITRUS JUICES: only apple, WHITE grape, WHITE cranberry Sports drinks like Gatorade or Powerade (NO RED)   Oral Hygiene is also important to reduce your risk of infection.        Remember - BRUSH YOUR TEETH THE MORNING OF SURGERY WITH YOUR REGULAR TOOTHPASTE  Do NOT smoke after Midnight the night before surgery.  STOP TAKING all Vitamins, Herbs and supplements 1 week before your surgery.   Take ONLY these medicines the morning of surgery with A SIP OF WATER : fluoxetine,loratadine,amlodipine.Tylenol  as needed.Use inhalers as usual and bring them.                   You may not have any metal on your body including hair pins, jewelry, and body piercing  Do not wear make-up, lotions, powders, perfumes / cologne, or deodorant  Do not wear nail polish including gel and S&S, artificial / acrylic nails, or any other type of covering on natural nails including finger and toenails. If you have artificial nails, gel coating, etc., that needs to be removed by a nail salon, Please have this removed prior to surgery. Not doing so may mean that your surgery could be cancelled or delayed if the Surgeon or anesthesia staff feels like they are unable to monitor you safely.   Do not shave 48 hours prior to surgery to avoid nicks in your skin which may contribute to postoperative infections.   Contacts, Hearing Aids, dentures or bridgework may not be worn into surgery. DENTURES WILL BE REMOVED PRIOR TO SURGERY PLEASE DO NOT APPLY Poly grip OR ADHESIVES!!!  You may bring a small overnight bag with you on the day of surgery, only  pack items that are not valuable. [facility] IS NOT RESPONSIBLE   FOR VALUABLES THAT ARE LOST OR STOLEN.   Patients discharged on the day of surgery will not be allowed to drive home.  Someone NEEDS to stay with you for the first 24 hours after anesthesia.  Do not bring your home medications to the hospital. The Pharmacy will dispense medications listed on your medication list to you during your admission in the Hospital.  Special Instructions: Bring a copy of your healthcare power of attorney and living will documents the day of surgery, if you wish to have them scanned into your [facility] Records- EPIC  Please read over the following fact sheets you were given: IF YOU HAVE QUESTIONS ABOUT YOUR PRE-OP INSTRUCTIONS, PLEASE CALL [phone]   [facility] - Preparing for Surgery      Before surgery, you can play an important role.  Because skin is not sterile, your skin needs to be as free of germs as possible.  You can reduce the number of germs on your skin by washing with CHG (chlorahexidine gluconate) soap before surgery.  CHG is an antiseptic cleaner which kills germs and bonds with the skin to continue killing germs even after washing. Please DO NOT use if you have an allergy to CHG or antibacterial soaps.  If your skin becomes reddened/irritated stop using the CHG and inform your nurse when you arrive at Short Stay. Do not shave (including legs and underarms) for at least 48 hours prior to the first CHG shower.  You may shave your face/neck.  Please follow these instructions carefully:  1.  Shower with CHG Soap the night before surgery ONLY (DO NOT USE THE SOAP THE MORNING OF SURGERY).  2.  If you choose to wash your hair, wash your hair first as usual with your normal  shampoo.  3.  After you shampoo, rinse your hair and body thoroughly to remove the shampoo.  4.  Use CHG as you would any other liquid soap.  You can apply chg directly to the skin and  wash.  Gently with a scrungie or clean washcloth.  5.  Apply the CHG Soap to your body ONLY FROM THE NECK DOWN.   Do not use on face/ open                           Wound or open sores. Avoid contact with eyes, ears mouth and genitals (private parts).                       Wash face,  Genitals (private parts) with your normal soap.             6.  Wash thoroughly, paying special attention to the area where your  surgery  will be performed.  7.  Thoroughly rinse your body with warm water  from the neck down.  8.  DO NOT shower/wash with your normal soap after using and rinsing off the CHG Soap.                9.  Pat yourself dry with a clean towel.            10.  Wear clean pajamas.            11.  Place clean sheets on your bed the night of your first shower and do not  sleep with pets.  Day of Surgery : Do not apply any CHG, lotions/deodorants the morning of surgery.  Please wear clean clothes to the hospital/surgery center.   FAILURE TO FOLLOW THESE INSTRUCTIONS MAY RESULT IN THE CANCELLATION OF YOUR SURGERY  PATIENT SIGNATURE_________________________________  NURSE SIGNATURE__________________________________  ________________________________________________________________________

## 2024-07-19 ENCOUNTER — Encounter (HOSPITAL_COMMUNITY): Payer: Self-pay

## 2024-07-19 ENCOUNTER — Other Ambulatory Visit: Payer: Self-pay

## 2024-07-19 ENCOUNTER — Encounter (HOSPITAL_COMMUNITY)
Admission: RE | Admit: 2024-07-19 | Discharge: 2024-07-19 | Disposition: A | Discharge status: Home / Self Care | Source: Ambulatory Visit | Attending: Urology | Admitting: Urology

## 2024-07-19 VITALS — BP 147/95 | HR 74 | Temp 98.5°F | Ht 64.5 in | Wt 164.0 lb

## 2024-07-19 DIAGNOSIS — Z01818 Encounter for other preprocedural examination: Secondary | ICD-10-CM | POA: Insufficient documentation

## 2024-07-19 DIAGNOSIS — I10 Essential (primary) hypertension: Secondary | ICD-10-CM | POA: Diagnosis not present

## 2024-07-19 HISTORY — DX: Essential (primary) hypertension: I10

## 2024-07-19 HISTORY — DX: Anemia, unspecified: D64.9

## 2024-07-19 HISTORY — DX: Unspecified asthma, uncomplicated: J45.909

## 2024-07-19 LAB — CBC
HCT: 42.7 % (ref 36.0–46.0)
Hemoglobin: 14.4 g/dL (ref 12.0–15.0)
MCH: 32.2 pg (ref 26.0–34.0)
MCHC: 33.7 g/dL (ref 30.0–36.0)
MCV: 95.5 fL (ref 80.0–100.0)
Platelets: 291 K/uL (ref 150–400)
RBC: 4.47 MIL/uL (ref 3.87–5.11)
RDW: 12.8 % (ref 11.5–15.5)
WBC: 4.7 K/uL (ref 4.0–10.5)
nRBC: 0 % (ref 0.0–0.2)

## 2024-07-19 LAB — BASIC METABOLIC PANEL WITH GFR
Anion gap: 10 (ref 5–15)
BUN: 17 mg/dL (ref 6–20)
CO2: 23 mmol/L (ref 22–32)
Calcium: 8.8 mg/dL — ABNORMAL LOW (ref 8.9–10.3)
Chloride: 107 mmol/L (ref 98–111)
Creatinine, Ser: 0.91 mg/dL (ref 0.44–1.00)
GFR, Estimated: >60 mL/min (ref >60)
Glucose, Bld: 97 mg/dL (ref 70–99)
Potassium: 4.5 mmol/L (ref 3.5–5.1)
Sodium: 140 mmol/L (ref 135–145)

## 2024-07-19 NOTE — Progress Notes (Signed)
 For Anesthesia: PCP - Dwight Trula SQUIBB, MD  Cardiologist - N/A  Bowel Prep reminder:  Chest x-ray -  EKG - 07/19/24 Stress Test -  ECHO -  Cardiac Cath -  Pacemaker/ICD device last checked: Pacemaker orders received: Device Rep notified:  Spinal Cord Stimulator:N/A  Sleep Study - Yes CPAP - N/A  Fasting Blood Sugar - N/A Checks Blood Sugar _____ times a day Date and result of last Hgb A1c-  Last dose of GLP1 agonist- N/A GLP1 instructions: Hold 7 days prior to schedule (Hold 24 hours-daily)   Last dose of SGLT-2 inhibitors- N/A SGLT-2 instructions: Hold 72 hours prior to surgery  Blood Thinner Instructions:N/A Last Dose: Time last taken:  Aspirin Instructions:N/A Last Dose: Time last taken:  Activity level: Can go up a flight of stairs and activities of daily living without stopping and without chest pain and/or shortness of breath   Able to exercise without chest pain and/or shortness of breath  Anesthesia review: Hx: HTN,OSA(NO CPAP),Hypoglycemia  Patient denies shortness of breath, fever, cough and chest pain at PAT appointment   Patient verbalized understanding of instructions that were reviewed over the telephone.

## 2024-07-24 NOTE — H&P (Signed)
 Visit Note - July 04, 2024 Ann Davenport, Ann Davenport MRN: [id number] Phone: [phone] DOB: 24-Jun-1966 Sex: Female PMS ID: [id number] Ann Davenport (Primary Provider) (Bill Under) Page 1 [phone] Work [phone] Fax [facility] [address] [place] [address] [name], [place] [address] Social History Single Question Alcohol Screening: 3 days EtOH less than 1 drink per day Smoking status - Unspecified Tobacco Use Does not use vaping products Does not use smokeless tobacco Medications amlodipine 5 mg Oral - tablet fluoxetine 20 mg Oral - capsule hydrochlorothiazide 12.5 mg Oral - tablet Prozac oral valsartan 80 mg Oral - tablet Allergies Sulfa (Sulfonamide Antibiotics) - Anaphylaxis Cipro epinephrine  Sulfa (Sulfonamide Antibiotics) Medical History Elevated blood pressure Surgical History Hysterectomy CC/HPI: Bladder cancer Ann Davenport follows up today for continued surveillance of her history of bladder cancer. She denies any hematuria and has remained in stable overall health over the past year. She has noted some increased periodic urinary urgency although this has not been very bothersome overall. Historical Summary: 1) Bladder cancer: She presented to Dr. Alvia at [facility] in the summer of 2021 with vaginal bleeding vs hematuria and was found to have a 2 cm bladder tumor just lateral to the right ureteral orifice. TURBT was performed on 03/04/20 confirming a low grade, Ta urothelial carcinoma. Subsequent cystoscopic follow up were unremarkable until July 2023 when concern was raised about findings possibly suspicious for CIS but cytology was negative. Jul 2021: Low grade, Ta, solitary 2 cm tumor (low risk) Oct 2023: Bladder biopsies - chronic inflammation, no malignancy Exam: Exam Female Urethra Exam: Normal appearing urethra Data Reviewed:  1 Ordering of each unique test (Order Urine Cytology (send-out)) Tests Cystoscopy Procedure:  cystourethroscopy Consent: We discussed the purpose of cystoscopy along with the risks, benefits and alternatives. The patient gives informed consent to proceed. Preparation and Positioning: The genital area was prepped and draped in standard sterile fashion. No antibiotic was administered prior to the procedure. The patient was placed in the dorsal lithotomy position. Flexible Cystourethroscopy: A 16 Fr flexible cystoscope was used to intubate the urethral meatus. The urethra was traversed under direct vision and the bladder was entered. The bladder was inspected in a systematic fashion. At the end of the procedure, the scope was removed without difficulty. No antibiotic was administered after the procedure. Postcare: The patient was informed that it will be normal to experience some irritative voiding symptoms and blood in the urine over the next 24 hours. Maintaining adequate hydration and flushing out the urinary tract will help speed the resolution of these symptoms. The patient has been instructed to seek immediate medical attention if the symptoms persist beyond 24 hours, are intolerable or are accompanied by back pain, fevers or chills. These might be signs of an infection and may warrant further evaluation or treatment. The patient is also instructed to call the office with any additional questions or concerns. General cystoscopic findings: normal urethra   Other findings: Inspection of the bladder revealed frondular growth consistent with tumor recurrence just inside the bladder neck at the left hemitrigone. This did not appear to extend to the left ureteral orifice. A bladder washing was obtained for cytology. No other bladder tumors, stones, or other mucosal pathology was identified. The ureteral orifice ease appeared to be effluxing clear urine. Visit Note - July 04, 2024 Ann Davenport, Ann Davenport MRN: [id number] Phone: [phone] DOB: 10/24/1965 Sex: Female PMS  ID: [id number] Ann Davenport (Primary Provider) (Bill Under) Page 2 [phone] Work [phone] Fax [facility] [address] [place], [place] [address] Impression/Plan: 1. Bladder cancer: We discussed her findings on her cystoscopy today that do suggest concern for recurrent bladder cancer. Her tumor is located toward the left hemitrigone and does not appear to be definitively involving the left ureteral orifice. I have recommended that she proceed with cystoscopy and transurethral resection of her bladder tumor. She understands the possible risk of necessitating a temporary left ureteral stent. We also discussed the possibility of intravesical instillation of gemcitabine postoperatively. The potential risks, complications, and the expected recovery process were discussed in detail. She gives informed consent to proceed. This will be scheduled for the near future. History of Bladder Cancer (Z85.51) Plan: Order Urine Cytology (send-out). Urine cytology ordered. Specimen source: voided urine. Plan: Separate and Identifiable Documentation.   We discussed her urothelial carcinoma situation, risk for recurrence, and additional test/plan for ongoing surveillance. Plan: Visit complexity. Medical care services associated with ongoing care, beyond the standard evaluation and management Indication: Provided medical care services as part of ongoing care related to the patient's single, serious or complex chronic condition. Plan: Counseling for Bladder Cancer. I counseled the patient regarding the following:   We discussed the diagnosis and management plan. After counseling, we decided on the following plan: TURBT Plan: Schedule Surgery. SURGERY SCHEDULING ORDER Procedure(s): [phone]: cystourethroscopy, with fulguration (including cryosurgery or laser surgery) and/or resection of; medium bladder tumor(s) (2.0 to 5.0 cm) Comments: Cystoscopy, transurethral resection of  bladder tumor, possible postoperative instillation of intravesical gemcitabine, possible left ureteral stent placement Diagnosis codes: History of Bladder Cancer Z85.51 Target surgery date: November or December. Surgeon: Renda. Facility: [facility]. Anesthesia: general. Estimated Time: 45 min. Testing Requested: BMP Preop Medications Required: Ancef  2 g - 2 g iv before surgery (prior to incision) Additional Comments: Follow-up in 1 to 2 weeks to discuss pathology results with [facility] Provider: GRETEL RENDA Priority: high 1. Follow up for: Further Evaluation and Management. Other Instructions: Will schedule for surgery. Staff: Ann RENDA (Primary Provider) Cherylin Under) Visit Note - July 04, 2024 Ann Davenport, Ann Davenport MRN: [id number] Phone: [phone] DOB: August 14, 1966 Sex: Female PMS ID: [id number] Ann RENDA (Primary Provider) (Bill Under) Page 3 [phone] Work [phone] Fax [facility] [address] [place] [address] [place], [place] [address] Electronically Signed By: Ann RENDA, 07/04/2024 07:16 PM EST

## 2024-07-27 NOTE — Anesthesia Preprocedure Evaluation (Addendum)
 Anesthesia Evaluation  Patient identified by MRN, date of birth, ID band Patient awake    Reviewed: Allergy & Precautions, NPO status , Patient's Chart, lab work & pertinent test results, reviewed documented beta blocker date and time   History of Anesthesia Complications (+) PONV and history of anesthetic complications  Airway Mallampati: II  TM Distance: >3 FB Neck ROM: Full    Dental  (+) Teeth Intact   Pulmonary asthma , sleep apnea and Continuous Positive Airway Pressure Ventilation , former smoker   breath sounds clear to auscultation       Cardiovascular hypertension, Pt. on medications Normal cardiovascular exam Rhythm:Regular Rate:Normal     Neuro/Psych  PSYCHIATRIC DISORDERS Anxiety Depression     Neuromuscular disease    GI/Hepatic Neg liver ROS,GERD  Medicated and Controlled,,  Endo/Other  negative endocrine ROS    Renal/GU Lab Results      Component                Value               Date                      NA                       140                 07/19/2024                CL                       107                 07/19/2024                K                        4.5                 07/19/2024                CO2                      23                  07/19/2024                BUN                      17                  07/19/2024                CREATININE               0.91                07/19/2024                GFRNONAA                 >60                 07/19/2024                CALCIUM  8.8 (L)             07/19/2024                GLUCOSE                  97                  07/19/2024              Bladder Ca    Musculoskeletal  (+)  Fibromyalgia -  Abdominal   Peds  Hematology  (+) Blood dyscrasia, anemia Lab Results      Component                Value               Date                      WBC                      4.7                 07/19/2024                 HGB                      14.4                07/19/2024                HCT                      42.7                07/19/2024                MCV                      95.5                07/19/2024                PLT                      291                 07/19/2024              Anesthesia Other Findings   Reproductive/Obstetrics                              Anesthesia Physical Anesthesia Plan  ASA: 3  Anesthesia Plan: General   Post-op Pain Management: Dilaudid  IV, Ofirmev  IV (intra-op)* and Precedex   Induction: Intravenous  PONV Risk Score and Plan: 4 or greater and Treatment may vary due to age or medical condition, Midazolam , Ondansetron , Dexamethasone , Propofol  infusion and TIVA  Airway Management Planned: LMA  Additional Equipment: None  Intra-op Plan:   Post-operative Plan: Extubation in OR  Informed Consent: I have reviewed the patients History and Physical, chart, labs and discussed the procedure including the risks, benefits and alternatives for the proposed anesthesia with the patient or authorized representative who has indicated his/her understanding and acceptance.     Dental advisory given  Plan Discussed with: CRNA and Anesthesiologist  Anesthesia Plan Comments:  Anesthesia Quick Evaluation

## 2024-07-30 ENCOUNTER — Ambulatory Visit (HOSPITAL_COMMUNITY): Admitting: Anesthesiology

## 2024-07-30 ENCOUNTER — Encounter (HOSPITAL_COMMUNITY)
Admission: RE | Disposition: A | Discharge status: Home / Self Care | Payer: Self-pay | Source: Home / Self Care | Attending: Urology

## 2024-07-30 ENCOUNTER — Encounter (HOSPITAL_COMMUNITY): Payer: Self-pay | Admitting: Urology

## 2024-07-30 ENCOUNTER — Ambulatory Visit (HOSPITAL_COMMUNITY)
Admission: RE | Admit: 2024-07-30 | Discharge: 2024-07-30 | Disposition: A | Discharge status: Home / Self Care | Attending: Urology | Admitting: Urology

## 2024-07-30 ENCOUNTER — Ambulatory Visit (HOSPITAL_COMMUNITY): Payer: Self-pay | Admitting: Physician Assistant

## 2024-07-30 DIAGNOSIS — I10 Essential (primary) hypertension: Secondary | ICD-10-CM

## 2024-07-30 DIAGNOSIS — J45909 Unspecified asthma, uncomplicated: Secondary | ICD-10-CM | POA: Diagnosis not present

## 2024-07-30 DIAGNOSIS — D494 Neoplasm of unspecified behavior of bladder: Secondary | ICD-10-CM

## 2024-07-30 DIAGNOSIS — G4730 Sleep apnea, unspecified: Secondary | ICD-10-CM | POA: Diagnosis not present

## 2024-07-30 HISTORY — PX: TRANSURETHRAL RESECTION OF BLADDER TUMOR: SHX2575

## 2024-07-30 SURGERY — TURBT (TRANSURETHRAL RESECTION OF BLADDER TUMOR)
Anesthesia: General

## 2024-07-30 MED ORDER — STERILE WATER FOR IRRIGATION IR SOLN
Status: DC | PRN
  Administered 2024-07-30: 500 mL

## 2024-07-30 MED ORDER — ACETAMINOPHEN 10 MG/ML IV SOLN
INTRAVENOUS | Status: AC
  Filled 2024-07-30: qty 100

## 2024-07-30 MED ORDER — CEFAZOLIN SODIUM-DEXTROSE 2-4 GM/100ML-% IV SOLN
2.0000 g | INTRAVENOUS | Status: AC
  Administered 2024-07-30: 2 g via INTRAVENOUS
  Filled 2024-07-30: qty 100

## 2024-07-30 MED ORDER — SODIUM CHLORIDE 0.9 % IR SOLN
Status: DC | PRN
  Administered 2024-07-30: 3000 mL via INTRAVESICAL

## 2024-07-30 MED ORDER — GEMCITABINE CHEMO FOR BLADDER INSTILLATION 2000 MG
2000.0000 mg | Freq: Once | INTRAVENOUS | Status: AC
  Administered 2024-07-30: 2000 mg via INTRAVESICAL
  Filled 2024-07-30: qty 2000

## 2024-07-30 MED ORDER — HYDROMORPHONE HCL 1 MG/ML IJ SOLN: 0.2500 mg | INTRAMUSCULAR | Status: DC | PRN

## 2024-07-30 MED ORDER — ONDANSETRON HCL 4 MG/2ML IJ SOLN: 4.0000 mg | Freq: Once | INTRAMUSCULAR | Status: DC | PRN

## 2024-07-30 MED ORDER — MIDAZOLAM HCL (PF) 2 MG/2ML IJ SOLN
INTRAMUSCULAR | Status: DC | PRN
  Administered 2024-07-30: 2 mg via INTRAVENOUS

## 2024-07-30 MED ORDER — ORAL CARE MOUTH RINSE: 15.0000 mL | Freq: Once | OROMUCOSAL | Status: AC

## 2024-07-30 MED ORDER — PHENYLEPHRINE HCL (PRESSORS) 10 MG/ML IV SOLN
INTRAVENOUS | Status: DC | PRN
  Administered 2024-07-30 (×2): 80 ug via INTRAVENOUS

## 2024-07-30 MED ORDER — LACTATED RINGERS IV SOLN: INTRAVENOUS | Status: DC

## 2024-07-30 MED ORDER — PROPOFOL 500 MG/50ML IV EMUL
INTRAVENOUS | Status: DC | PRN
  Administered 2024-07-30: 25 ug/kg/min via INTRAVENOUS

## 2024-07-30 MED ORDER — ACETAMINOPHEN 10 MG/ML IV SOLN
INTRAVENOUS | Status: DC | PRN
  Administered 2024-07-30: 1000 mg via INTRAVENOUS

## 2024-07-30 MED ORDER — ONDANSETRON HCL 4 MG/2ML IJ SOLN
INTRAMUSCULAR | Status: DC | PRN
  Administered 2024-07-30: 4 mg via INTRAVENOUS

## 2024-07-30 MED ORDER — CHLORHEXIDINE GLUCONATE 0.12 % MT SOLN
15.0000 mL | Freq: Once | OROMUCOSAL | Status: AC
  Administered 2024-07-30: 15 mL via OROMUCOSAL

## 2024-07-30 MED ORDER — DEXAMETHASONE SOD PHOSPHATE PF 10 MG/ML IJ SOLN
INTRAMUSCULAR | Status: DC | PRN
  Administered 2024-07-30: 5 mg via INTRAVENOUS

## 2024-07-30 MED ORDER — LIDOCAINE HCL (PF) 2 % IJ SOLN
INTRAMUSCULAR | Status: AC
  Filled 2024-07-30: qty 5

## 2024-07-30 MED ORDER — ONDANSETRON HCL 4 MG/2ML IJ SOLN
INTRAMUSCULAR | Status: AC
  Filled 2024-07-30: qty 2

## 2024-07-30 MED ORDER — FENTANYL CITRATE (PF) 100 MCG/2ML IJ SOLN
INTRAMUSCULAR | Status: DC | PRN
  Administered 2024-07-30 (×2): 50 ug via INTRAVENOUS

## 2024-07-30 MED ORDER — LACTATED RINGERS IV SOLN: INTRAVENOUS | Status: DC | PRN

## 2024-07-30 MED ORDER — OXYCODONE HCL 5 MG PO TABS: 5.0000 mg | ORAL_TABLET | Freq: Once | ORAL | Status: DC | PRN

## 2024-07-30 MED ORDER — LIDOCAINE HCL (CARDIAC) PF 100 MG/5ML IV SOSY
PREFILLED_SYRINGE | INTRAVENOUS | Status: DC | PRN
  Administered 2024-07-30: 80 mg via INTRAVENOUS

## 2024-07-30 MED ORDER — OXYCODONE HCL 5 MG/5ML PO SOLN: 5.0000 mg | Freq: Once | ORAL | Status: DC | PRN

## 2024-07-30 MED ORDER — PROPOFOL 10 MG/ML IV BOLUS
INTRAVENOUS | Status: DC | PRN
  Administered 2024-07-30: 200 mg via INTRAVENOUS

## 2024-07-30 MED ORDER — DROPERIDOL 2.5 MG/ML IJ SOLN: 0.6250 mg | Freq: Once | INTRAMUSCULAR | Status: DC | PRN

## 2024-07-30 MED ORDER — MIDAZOLAM HCL 2 MG/2ML IJ SOLN
INTRAMUSCULAR | Status: AC
  Filled 2024-07-30: qty 2

## 2024-07-30 MED ORDER — FENTANYL CITRATE (PF) 100 MCG/2ML IJ SOLN
INTRAMUSCULAR | Status: AC
  Filled 2024-07-30: qty 2

## 2024-07-30 MED ORDER — PROPOFOL 10 MG/ML IV BOLUS
INTRAVENOUS | Status: AC
  Filled 2024-07-30: qty 20

## 2024-07-30 SURGICAL SUPPLY — 18 items
BAG URINE DRAIN 2000ML AR STRL (UROLOGICAL SUPPLIES) IMPLANT
BAG URO CATCHER STRL LF (MISCELLANEOUS) ×2 IMPLANT
CATH SILICONE 16FRX5CC (CATHETERS) IMPLANT
CATH URETL OPEN END 6FR 70 (CATHETERS) IMPLANT
CLOTH BEACON ORANGE TIMEOUT ST (SAFETY) ×2 IMPLANT
DRAPE FOOT SWITCH (DRAPES) ×2 IMPLANT
ELECT REM PT RETURN 15FT ADLT (MISCELLANEOUS) ×2 IMPLANT
GLOVE SURG LX STRL 7.5 STRW (GLOVE) ×2 IMPLANT
GOWN STRL REUS W/ TWL XL LVL3 (GOWN DISPOSABLE) ×2 IMPLANT
GUIDEWIRE STR DUAL SENSOR (WIRE) ×2 IMPLANT
GUIDEWIRE ZIPWRE .038 STRAIGHT (WIRE) IMPLANT
KIT TURNOVER KIT A (KITS) ×2 IMPLANT
LOOP CUT BIPOLAR 24F LRG (ELECTROSURGICAL) IMPLANT
MANIFOLD NEPTUNE II (INSTRUMENTS) ×2 IMPLANT
PACK CYSTO (CUSTOM PROCEDURE TRAY) ×2 IMPLANT
SYRINGE TOOMEY IRRIG 70ML (MISCELLANEOUS) IMPLANT
TUBING CONNECTING 10 (TUBING) ×2 IMPLANT
TUBING UROLOGY SET (TUBING) ×2 IMPLANT

## 2024-07-30 NOTE — Interval H&P Note (Signed)
 History and Physical Interval Note:  07/30/2024 3:18 PM  Ann Davenport  has presented today for surgery, with the diagnosis of HISTORY OF BLADDER CANCER.  The various methods of treatment have been discussed with the patient and family. After consideration of risks, benefits and other options for treatment, the patient has consented to  Procedure(s): TURBT (TRANSURETHRAL RESECTION OF BLADDER TUMOR) (N/A) CYSTOSCOPY, WITH STENT INSERTION (Left) as a surgical intervention.  The patient's history has been reviewed, patient examined, no change in status, stable for surgery.  I have reviewed the patient's chart and labs.  Questions were answered to the patient's satisfaction.     [employer]

## 2024-07-30 NOTE — Discharge Instructions (Addendum)
 You may see some blood in the urine and may have some burning with urination for 48-72 hours. You also may notice that you have to urinate more frequently or urgently after your procedure which is normal.  You should call should you develop an inability urinate, fever > 101, persistent nausea and vomiting that prevents you from eating or drinking to stay hydrated.

## 2024-07-30 NOTE — Anesthesia Procedure Notes (Signed)
 Procedure Name: Intubation Date/Time: 07/30/2024 3:58 PM  Performed by: Leaf Kern[employer], CRNAPre-anesthesia Checklist: Patient identified, Emergency Drugs available, Suction available and Patient being monitored Patient Re-evaluated:Patient Re-evaluated prior to induction Oxygen Delivery Method: Circle system utilized Preoxygenation: Pre-oxygenation with 100% oxygen Induction Type: IV induction Ventilation: Mask ventilation without difficulty LMA: LMA inserted LMA Size: 4.0 Tube type: Oral Number of attempts: 1 Airway Equipment and Method: Stylet and Oral airway Placement Confirmation: positive ETCO2 and breath sounds checked- equal and bilateral Tube secured with: Tape Dental Injury: Teeth and Oropharynx as per pre-operative assessment

## 2024-07-30 NOTE — Anesthesia Postprocedure Evaluation (Signed)
 Anesthesia Post Note  Patient: Occupational Psychologist) Performed: TURBT (TRANSURETHRAL RESECTION OF BLADDER TUMOR)     Patient location during evaluation: PACU Anesthesia Type: General Level of consciousness: awake and alert and oriented Pain management: pain level controlled Vital Signs Assessment: post-procedure vital signs reviewed and stable Respiratory status: spontaneous breathing, nonlabored ventilation and respiratory function stable Cardiovascular status: blood pressure returned to baseline and stable Postop Assessment: no apparent nausea or vomiting Anesthetic complications: no   No notable events documented.  Last Vitals:  Vitals:   07/30/24 1800 07/30/24 1815  BP: (!) 136/98 (!) 146/93  Pulse: 72 77  Resp: 20 18  Temp: 36.6 C   SpO2: 94% 92%    Last Pain:  Vitals:   07/30/24 1815  TempSrc:   PainSc: 0-No pain                 Rozelia Catapano A.

## 2024-07-30 NOTE — Op Note (Signed)
 Preoperative diagnosis: Bladder tumor (1 cm)  Postoperative diagnosis: Bladder tumor (1 cm)  Procedure: 1.  Cystoscopy 2.  Transurethral resection of bladder tumor 3.  Postoperative intravesical instillation of gemcitabine  Surgeon: Gretel CANDIE Renda Mickey MD  Anesthesia: General  Complications: None  EBL: Minimal  Specimen: Bladder tumor  Disposition of specimen: Pathology  Indication: Ann Davenport is a 58 year old female with a history of bladder cancer.  She was recently noted to have a recurrent tumor on surveillance cystoscopy measuring approximately 1 cm.  After reviewing options for management, she elected to proceed with the above procedures.  The potential risks, complications, and the expected recovery process was discussed in detail.  Informed consent was obtained.  Description of procedure: The patient was taken to the operating room and a general anesthetic was administered.  She was given preoperative antibiotics, placed in the dorsolithotomy position, and prepped and draped in the usual sterile fashion.  Next, preoperative timeout was performed.  Cystourethroscopy was performed with a 26 French resectoscope utilizing the visual obturator.  This did allow complete inspection of the bladder which revealed the previously noted 1 cm papillary tumor on the right side of the hemitrigone just in front of the right ureteral orifice.  No other abnormalities were identified.  Using a bipolar loop, the tumor was resected in its entirety along with surrounding tissue.  This was sent for permanent pathologic analysis.  Bipolar cautery was used for hemostasis.  The bladder was emptied and reinspected and hemostasis appeared excellent.  The patient tolerated the procedure well without complications.  She was able to be awakened and transferred to the recovery unit in satisfactory condition.  In the PACU, 2000 mg of gemcitabine was instilled intravesically into the bladder and left indwelling for 1  hour.

## 2024-07-30 NOTE — Transfer of Care (Signed)
 Immediate Anesthesia Transfer of Care Note  Patient: Ann Davenport  Procedure(s) Performed: TURBT (TRANSURETHRAL RESECTION OF BLADDER TUMOR)  Patient Location: PACU  Anesthesia Type:General  Level of Consciousness: awake and alert   Airway & Oxygen Therapy: Patient Spontanous Breathing and Patient connected to nasal cannula oxygen  Post-op Assessment: Report given to RN and Post -op Vital signs reviewed and stable  Post vital signs: Reviewed  Last Vitals:  Vitals Value Taken Time  BP    Temp    Pulse    Resp    SpO2      Last Pain:  Vitals:   07/30/24 1323  TempSrc:   PainSc: 0-No pain         Complications: No notable events documented.

## 2024-07-31 ENCOUNTER — Encounter (HOSPITAL_COMMUNITY): Payer: Self-pay | Admitting: Urology

## 2024-08-01 LAB — SURGICAL PATHOLOGY
